# Patient Record
Sex: Female | Born: 1990 | Race: White | Hispanic: No | Marital: Married | Smoking: Never smoker
Health system: Southern US, Community
[De-identification: ages and names within clinical notes are randomized; demographics above are authoritative.]

## PROBLEM LIST (undated history)

## (undated) ENCOUNTER — Inpatient Hospital Stay (HOSPITAL_COMMUNITY): Payer: Self-pay

## (undated) DIAGNOSIS — Z789 Other specified health status: Secondary | ICD-10-CM

## (undated) DIAGNOSIS — O4400 Placenta previa specified as without hemorrhage, unspecified trimester: Secondary | ICD-10-CM

## (undated) HISTORY — PX: NO PAST SURGERIES: SHX2092

---
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
## 2010-12-28 ENCOUNTER — Other Ambulatory Visit (HOSPITAL_COMMUNITY)
Admission: RE | Admit: 2010-12-28 | Discharge: 2010-12-28 | Disposition: A | Discharge status: Home / Self Care | Payer: BC Managed Care – PPO | Source: Ambulatory Visit | Attending: Family Medicine | Admitting: Family Medicine

## 2010-12-28 DIAGNOSIS — Z124 Encounter for screening for malignant neoplasm of cervix: Secondary | ICD-10-CM | POA: Insufficient documentation

## 2012-02-01 ENCOUNTER — Other Ambulatory Visit (HOSPITAL_COMMUNITY)
Admission: RE | Admit: 2012-02-01 | Discharge: 2012-02-01 | Disposition: A | Discharge status: Home / Self Care | Payer: BC Managed Care – PPO | Source: Ambulatory Visit | Attending: Family Medicine | Admitting: Family Medicine

## 2012-02-01 DIAGNOSIS — Z124 Encounter for screening for malignant neoplasm of cervix: Secondary | ICD-10-CM | POA: Insufficient documentation

## 2013-02-10 ENCOUNTER — Other Ambulatory Visit (HOSPITAL_COMMUNITY)
Admission: RE | Admit: 2013-02-10 | Discharge: 2013-02-10 | Disposition: A | Discharge status: Home / Self Care | Payer: BC Managed Care – PPO | Source: Ambulatory Visit | Attending: Family Medicine | Admitting: Family Medicine

## 2013-02-10 DIAGNOSIS — Z124 Encounter for screening for malignant neoplasm of cervix: Secondary | ICD-10-CM | POA: Insufficient documentation

## 2016-05-29 ENCOUNTER — Other Ambulatory Visit (HOSPITAL_COMMUNITY): Payer: Self-pay | Admitting: Obstetrics and Gynecology

## 2016-05-29 DIAGNOSIS — O209 Hemorrhage in early pregnancy, unspecified: Secondary | ICD-10-CM

## 2016-05-29 LAB — OB RESULTS CONSOLE HEPATITIS B SURFACE ANTIGEN: Hepatitis B Surface Ag: NEGATIVE

## 2016-05-29 LAB — OB RESULTS CONSOLE ANTIBODY SCREEN: Antibody Screen: NEGATIVE

## 2016-05-29 LAB — OB RESULTS CONSOLE ABO/RH: RH TYPE: POSITIVE

## 2016-05-29 LAB — OB RESULTS CONSOLE RPR: RPR: NONREACTIVE

## 2016-05-29 LAB — OB RESULTS CONSOLE GC/CHLAMYDIA
CHLAMYDIA, DNA PROBE: NEGATIVE
Gonorrhea: NEGATIVE

## 2016-05-29 LAB — OB RESULTS CONSOLE GBS: STREP GROUP B AG: POSITIVE

## 2016-05-29 LAB — OB RESULTS CONSOLE RUBELLA ANTIBODY, IGM: Rubella: IMMUNE

## 2016-05-29 LAB — OB RESULTS CONSOLE HIV ANTIBODY (ROUTINE TESTING): HIV: NONREACTIVE

## 2016-06-02 ENCOUNTER — Ambulatory Visit (HOSPITAL_COMMUNITY)
Admission: RE | Admit: 2016-06-02 | Discharge: 2016-06-02 | Disposition: A | Discharge status: Home / Self Care | Payer: Self-pay | Source: Ambulatory Visit | Attending: Obstetrics and Gynecology | Admitting: Obstetrics and Gynecology

## 2016-06-02 DIAGNOSIS — Z3A01 Less than 8 weeks gestation of pregnancy: Secondary | ICD-10-CM | POA: Insufficient documentation

## 2016-06-02 DIAGNOSIS — O209 Hemorrhage in early pregnancy, unspecified: Secondary | ICD-10-CM

## 2016-06-22 ENCOUNTER — Other Ambulatory Visit: Payer: Self-pay | Admitting: Obstetrics and Gynecology

## 2016-06-22 ENCOUNTER — Other Ambulatory Visit (HOSPITAL_COMMUNITY)
Admission: RE | Admit: 2016-06-22 | Discharge: 2016-06-22 | Disposition: A | Discharge status: Home / Self Care | Payer: Self-pay | Source: Ambulatory Visit | Attending: Obstetrics and Gynecology | Admitting: Obstetrics and Gynecology

## 2016-06-22 DIAGNOSIS — Z113 Encounter for screening for infections with a predominantly sexual mode of transmission: Secondary | ICD-10-CM | POA: Insufficient documentation

## 2016-06-22 DIAGNOSIS — Z01419 Encounter for gynecological examination (general) (routine) without abnormal findings: Secondary | ICD-10-CM | POA: Insufficient documentation

## 2016-06-26 LAB — CYTOLOGY - PAP

## 2016-10-02 NOTE — L&D Delivery Note (Signed)
Delivery Note At 8:13 AM a viable female was delivered via Vaginal, Spontaneous Delivery (Presentation:Occiput right anterior).  APGAR: 9, 9; weight 8 lb 8.7 oz (3875 g).   Placenta status: retained. Anesthesia was called to administer nitroglycerin after the placenta was in utero for over 30 minutes.  Manual removal of the placenta was performed the placenta was removed intact with a 3 vessel  Cord:   Uterine atony was noted after removal of the placent. 1000 mcg of cytotec given Per rectum. ... 0.2 mg of methergine given IM.  With resolution of atony   Cord pH: NA  Anesthesia:  Epidural  Episiotomy: None Lacerations: Periurethral Suture Repair: 3.0 vicryl Est. Blood Loss (mL): 500  Mom to postpartum.  Baby to Couplet care / Skin to Skin.  Vanessa Enrico J. 01/23/2017, 10:32 AM

## 2017-01-20 ENCOUNTER — Inpatient Hospital Stay (HOSPITAL_COMMUNITY)
Admission: AD | Admit: 2017-01-20 | Discharge: 2017-01-21 | Disposition: A | Discharge status: Home / Self Care | Payer: Self-pay | Source: Ambulatory Visit | Attending: Obstetrics and Gynecology | Admitting: Obstetrics and Gynecology

## 2017-01-20 ENCOUNTER — Encounter (HOSPITAL_COMMUNITY): Payer: Self-pay | Admitting: *Deleted

## 2017-01-20 DIAGNOSIS — Z3490 Encounter for supervision of normal pregnancy, unspecified, unspecified trimester: Secondary | ICD-10-CM | POA: Insufficient documentation

## 2017-01-20 NOTE — MAU Note (Signed)
Pt reports UC's q 3-4 minutes 

## 2017-01-20 NOTE — Progress Notes (Addendum)
G1@40 +[redacted] wksga. Here for ctx. Denies lof or bleeding. +fm. EFM applied by another nurse. VSS. See flow sheet for details.   GBS+ /No allergies  2230: SVE 3.5/40/BB  2250: Provider Clyde Lundborg notified. Report status of pt given. Orders received to have pt walk for an hr and recheck cervix.   2255: pt up walking. Instructed pt to return to room at 1145.   2315: relinquished care over to RN Suzette Battiest

## 2017-01-21 MED ORDER — ZOLPIDEM TARTRATE 5 MG PO TABS
5.0000 mg | ORAL_TABLET | Freq: Once | ORAL | Status: AC
  Administered 2017-01-21: 5 mg via ORAL
  Filled 2017-01-21: qty 1

## 2017-01-21 MED ORDER — ZOLPIDEM TARTRATE 5 MG PO TABS
5.0000 mg | ORAL_TABLET | Freq: Once | ORAL | Status: DC
  Filled 2017-01-21: qty 1

## 2017-01-21 MED ORDER — ONDANSETRON HCL 4 MG/2ML IJ SOLN
4.0000 mg | Freq: Once | INTRAMUSCULAR | Status: DC
  Filled 2017-01-21: qty 2

## 2017-01-22 ENCOUNTER — Inpatient Hospital Stay (HOSPITAL_COMMUNITY)
Admission: AD | Admit: 2017-01-22 | Discharge: 2017-01-25 | DRG: 767 | Disposition: A | Discharge status: Home / Self Care | Payer: Self-pay | Source: Ambulatory Visit | Attending: Obstetrics and Gynecology | Admitting: Obstetrics and Gynecology

## 2017-01-22 ENCOUNTER — Encounter (HOSPITAL_COMMUNITY): Payer: Self-pay

## 2017-01-22 ENCOUNTER — Other Ambulatory Visit: Payer: Self-pay | Admitting: Obstetrics and Gynecology

## 2017-01-22 ENCOUNTER — Inpatient Hospital Stay (HOSPITAL_COMMUNITY): Payer: Self-pay | Admitting: Anesthesiology

## 2017-01-22 ENCOUNTER — Telehealth (HOSPITAL_COMMUNITY): Payer: Self-pay | Admitting: *Deleted

## 2017-01-22 DIAGNOSIS — O480 Post-term pregnancy: Principal | ICD-10-CM | POA: Diagnosis present

## 2017-01-22 DIAGNOSIS — Z3A41 41 weeks gestation of pregnancy: Secondary | ICD-10-CM

## 2017-01-22 DIAGNOSIS — O99824 Streptococcus B carrier state complicating childbirth: Secondary | ICD-10-CM | POA: Diagnosis present

## 2017-01-22 DIAGNOSIS — O411230 Chorioamnionitis, third trimester, not applicable or unspecified: Secondary | ICD-10-CM | POA: Diagnosis present

## 2017-01-22 HISTORY — DX: Other specified health status: Z78.9

## 2017-01-22 LAB — CBC
HCT: 35.2 % — ABNORMAL LOW (ref 36.0–46.0)
HEMOGLOBIN: 12.3 g/dL (ref 12.0–15.0)
MCH: 32.6 pg (ref 26.0–34.0)
MCHC: 34.9 g/dL (ref 30.0–36.0)
MCV: 93.4 fL (ref 78.0–100.0)
PLATELETS: 256 10*3/uL (ref 150–400)
RBC: 3.77 MIL/uL — ABNORMAL LOW (ref 3.87–5.11)
RDW: 13.3 % (ref 11.5–15.5)
WBC: 10.3 10*3/uL (ref 4.0–10.5)

## 2017-01-22 LAB — TYPE AND SCREEN
ABO/RH(D): A POS
ANTIBODY SCREEN: NEGATIVE

## 2017-01-22 LAB — ABO/RH: ABO/RH(D): A POS

## 2017-01-22 MED ORDER — LIDOCAINE HCL (PF) 1 % IJ SOLN
INTRAMUSCULAR | Status: DC | PRN
  Administered 2017-01-22: 7 mL via EPIDURAL
  Administered 2017-01-22: 6 mL via EPIDURAL

## 2017-01-22 MED ORDER — LIDOCAINE HCL (PF) 1 % IJ SOLN
30.0000 mL | INTRAMUSCULAR | Status: DC | PRN
  Filled 2017-01-22: qty 30

## 2017-01-22 MED ORDER — SOD CITRATE-CITRIC ACID 500-334 MG/5ML PO SOLN: 30.0000 mL | ORAL | Status: DC | PRN

## 2017-01-22 MED ORDER — ONDANSETRON HCL 4 MG/2ML IJ SOLN: 4.0000 mg | Freq: Four times a day (QID) | INTRAMUSCULAR | Status: DC | PRN

## 2017-01-22 MED ORDER — LACTATED RINGERS IV SOLN
500.0000 mL | Freq: Once | INTRAVENOUS | Status: AC
  Administered 2017-01-22: 500 mL via INTRAVENOUS

## 2017-01-22 MED ORDER — LACTATED RINGERS IV SOLN
INTRAVENOUS | Status: DC
  Administered 2017-01-22 – 2017-01-23 (×3): via INTRAVENOUS

## 2017-01-22 MED ORDER — OXYTOCIN 40 UNITS IN LACTATED RINGERS INFUSION - SIMPLE MED
2.5000 [IU]/h | INTRAVENOUS | Status: DC
  Administered 2017-01-23: 2.5 [IU]/h via INTRAVENOUS
  Filled 2017-01-22: qty 1000

## 2017-01-22 MED ORDER — OXYCODONE-ACETAMINOPHEN 5-325 MG PO TABS: 2.0000 | ORAL_TABLET | ORAL | Status: DC | PRN

## 2017-01-22 MED ORDER — EPHEDRINE 5 MG/ML INJ: 10.0000 mg | INTRAVENOUS | Status: DC | PRN

## 2017-01-22 MED ORDER — PENICILLIN G POT IN DEXTROSE 60000 UNIT/ML IV SOLN
3.0000 10*6.[IU] | INTRAVENOUS | Status: DC
  Administered 2017-01-22 – 2017-01-23 (×3): 3 10*6.[IU] via INTRAVENOUS
  Filled 2017-01-22 (×6): qty 50

## 2017-01-22 MED ORDER — FENTANYL 2.5 MCG/ML BUPIVACAINE 1/10 % EPIDURAL INFUSION (WH - ANES)
14.0000 mL/h | INTRAMUSCULAR | Status: DC | PRN
  Administered 2017-01-22 – 2017-01-23 (×2): 14 mL/h via EPIDURAL
  Filled 2017-01-22 (×2): qty 100

## 2017-01-22 MED ORDER — PENICILLIN G POTASSIUM 5000000 UNITS IJ SOLR
5.0000 10*6.[IU] | Freq: Once | INTRAVENOUS | Status: AC
  Administered 2017-01-22: 5 10*6.[IU] via INTRAVENOUS
  Filled 2017-01-22: qty 5

## 2017-01-22 MED ORDER — DIPHENHYDRAMINE HCL 50 MG/ML IJ SOLN: 12.5000 mg | INTRAMUSCULAR | Status: DC | PRN

## 2017-01-22 MED ORDER — LACTATED RINGERS IV SOLN
500.0000 mL | INTRAVENOUS | Status: DC | PRN
  Administered 2017-01-23: 300 mL via INTRAVENOUS

## 2017-01-22 MED ORDER — PHENYLEPHRINE 40 MCG/ML (10ML) SYRINGE FOR IV PUSH (FOR BLOOD PRESSURE SUPPORT)
80.0000 ug | PREFILLED_SYRINGE | INTRAVENOUS | Status: DC | PRN
  Filled 2017-01-22: qty 10

## 2017-01-22 MED ORDER — PHENYLEPHRINE 40 MCG/ML (10ML) SYRINGE FOR IV PUSH (FOR BLOOD PRESSURE SUPPORT): 80.0000 ug | PREFILLED_SYRINGE | INTRAVENOUS | Status: DC | PRN

## 2017-01-22 MED ORDER — ACETAMINOPHEN 325 MG PO TABS: 650.0000 mg | ORAL_TABLET | ORAL | Status: DC | PRN

## 2017-01-22 MED ORDER — OXYCODONE-ACETAMINOPHEN 5-325 MG PO TABS: 1.0000 | ORAL_TABLET | ORAL | Status: DC | PRN

## 2017-01-22 MED ORDER — OXYTOCIN BOLUS FROM INFUSION
500.0000 mL | Freq: Once | INTRAVENOUS | Status: AC
  Administered 2017-01-23: 500 mL via INTRAVENOUS

## 2017-01-22 NOTE — Telephone Encounter (Signed)
Preadmission screen  

## 2017-01-22 NOTE — H&P (Signed)
Vanessa Allison is a 26 y.o. female G1P0 at 77 wks and 0 days based on LMP 04/10/2016 confirmed by u/s with EDD 01/15/2017. Prenatal care provided by Dr. Gerald Leitz with [facility]. Pregnancy complicated by GBS + and post dates. Pt presented to the office today for BPP which was 4 out of 8 with 2 off for no tone and breathing. Pt reports + FM. She has a contraction every 8 minutes. On cervical exam in the office 7 cm with bulging bag.    OB History    Gravida Para Term Preterm AB Living   1             SAB TAB Ectopic Multiple Live Births                 No past medical history on file. No past surgical history on file. Family History: family history includes Cancer in her paternal grandmother; Heart disease in her maternal grandfather and paternal grandfather. Social History:  has no tobacco, alcohol, and drug history on file. PMH None PSurgical Hx. None  Meds: PNV Allergies NKDA    Maternal Diabetes: No Genetic Screening: Declined Maternal Ultrasounds/Referrals: Normal Fetal Ultrasounds or other Referrals:  None Maternal Substance Abuse:  No Significant Maternal Medications: None Significant Maternal Lab Results:  Lab values include: Group B Strep positive Other Comments:  None  Review of Systems  Constitutional: Negative.   HENT: Negative.   Eyes: Negative.   Respiratory: Negative.   Cardiovascular: Negative.   Gastrointestinal: Negative.   Genitourinary: Negative.   Musculoskeletal: Negative.   Skin: Negative.   Neurological: Negative.   Endo/Heme/Allergies: Negative.   Psychiatric/Behavioral: Negative.    History   Last menstrual period 04/10/2016. Maternal Exam:  Uterine Assessment: Contraction strength is moderate.  Contraction duration is 1 minute. Contraction frequency is irregular.   Abdomen: Patient reports no abdominal tenderness. Estimated fetal weight is 8 lbs 1 oz on ultrasound today ( 74%ile) .   Fetal presentation: vertex  Introitus: Normal vulva.  Normal vagina.  Pelvis: adequate for delivery.      Physical Exam  Constitutional: She is oriented to person, place, and time. She appears well-developed and well-nourished.  HENT:  Head: Normocephalic and atraumatic.  Eyes: Conjunctivae are normal. Pupils are equal, round, and reactive to light.  Neck: Normal range of motion. Neck supple.  Cardiovascular: Normal rate and regular rhythm.   Respiratory: Effort normal and breath sounds normal.  GI: There is no tenderness.  Genitourinary: Vagina normal.  Musculoskeletal: Normal range of motion. She exhibits edema.  Neurological: She is alert and oriented to person, place, and time.  Skin: Skin is warm and dry.  Psychiatric: She has a normal mood and affect.    Prenatal labs: ABO, Rh: A/Positive/-- (08/28 0000) Antibody: Negative (08/28 0000) Rubella: Immune (08/28 0000) RPR: Nonreactive (08/28 0000)  HBsAg: Negative (08/28 0000)  HIV: Non-reactive (08/28 0000)  GBS: Positive (08/28 0000)   Assessment/Plan: 41 wks and 0 days bpp 4 out of 8 in office today  Active labor admitto Labor and delivery  Continuous EFM GBS positive- Penicillin Plan AROM once penicillin is in for 4 hours  Anticipate SVD   Vanessa Allison J. 01/22/2017, 12:54 PM

## 2017-01-22 NOTE — H&P (Deleted)
  The note originally documented on this encounter has been moved the the encounter in which it belongs.  

## 2017-01-22 NOTE — Anesthesia Preprocedure Evaluation (Signed)
Anesthesia Evaluation  Patient identified by MRN, date of birth, ID band Patient awake    Reviewed: Allergy & Precautions, H&P , NPO status , Patient's Chart, lab work & pertinent test results  Airway Mallampati: II  TM Distance: >3 FB Neck ROM: full    Dental no notable dental hx.    Pulmonary neg pulmonary ROS,    Pulmonary exam normal        Cardiovascular negative cardio ROS Normal cardiovascular exam     Neuro/Psych negative neurological ROS  negative psych ROS   GI/Hepatic negative GI ROS, Neg liver ROS,   Endo/Other  negative endocrine ROS  Renal/GU negative Renal ROS  negative genitourinary   Musculoskeletal negative musculoskeletal ROS (+)   Abdominal (+) + obese,   Peds  Hematology negative hematology ROS (+)   Anesthesia Other Findings   Reproductive/Obstetrics (+) Pregnancy                             Anesthesia Physical Anesthesia Plan  ASA: II  Anesthesia Plan: Epidural   Post-op Pain Management:    Induction:   Airway Management Planned:   Additional Equipment:   Intra-op Plan:   Post-operative Plan:   Informed Consent: I have reviewed the patients History and Physical, chart, labs and discussed the procedure including the risks, benefits and alternatives for the proposed anesthesia with the patient or authorized representative who has indicated his/her understanding and acceptance.     Plan Discussed with:   Anesthesia Plan Comments:         Anesthesia Quick Evaluation  

## 2017-01-22 NOTE — Progress Notes (Signed)
Vanessa Allison is a 26 y.o. G1P0 at [redacted]w[redacted]d admitted for active labor and bpp in office 4 out of 8.  Subjective: Pt uncomfortable with contractions. More frequent. No LOF no vaginal bleeding   Objective: BP (!) 142/80   Pulse 78   Temp 99.2 F (37.3 C) (Oral)   Resp 18   Ht  (1.676 m)   Wt 95.3 kg (210 lb)   LMP 04/10/2016   BMI 33.89 kg/m  No intake/output data recorded. No intake/output data recorded.  FHT:  FHR: 140 bpm, variability: moderate,  accelerations:  Present,  decelerations:  Absent and   UC:   regular, every 2-5 minutes SVE:   Dilation: 7.5 Effacement (%): 80 Station: -1 Exam by:: Dr. Richardson Dopp  Labs: Lab Results  Component Value Date   WBC 10.3 01/22/2017   HGB 12.3 01/22/2017   HCT 35.2 (L) 01/22/2017   MCV 93.4 01/22/2017   PLT 256 01/22/2017    Assessment / Plan: Spontaneous labor, progressing normally  Labor: ARom performed. reevaluate cervix in  2 hours or with pt report of pressure  Preeclampsia:  NA Fetal Wellbeing:  Category I Pain Control:  Labor support without medications I/D:  Pencillin Anticipated MOD:  NSVD  Hargun Spurling J. 01/22/2017, 6:38 PM

## 2017-01-22 NOTE — Anesthesia Procedure Notes (Signed)
Epidural Patient location during procedure: OB Start time: 01/22/2017 11:09 PM End time: 01/22/2017 11:13 PM  Staffing Anesthesiologist: Leilani Able  Preanesthetic Checklist Completed: patient identified, surgical consent, pre-op evaluation, timeout performed, IV checked, risks and benefits discussed and monitors and equipment checked  Epidural Patient position: sitting Prep: site prepped and draped and DuraPrep Patient monitoring: continuous pulse ox and blood pressure Approach: midline Location: L3-L4 Injection technique: LOR air  Needle:  Needle type: Tuohy  Needle gauge: 17 G Needle length: 9 cm and 9 Needle insertion depth: 5 cm cm Catheter type: closed end flexible Catheter size: 19 Gauge Catheter at skin depth: 10 cm Test dose: negative and Other  Assessment Sensory level: T9 Events: blood not aspirated, injection not painful, no injection resistance, negative IV test and no paresthesia  Additional Notes Reason for block:procedure for pain

## 2017-01-23 ENCOUNTER — Encounter (HOSPITAL_COMMUNITY): Payer: Self-pay

## 2017-01-23 LAB — RPR: RPR: NONREACTIVE

## 2017-01-23 MED ORDER — BENZOCAINE-MENTHOL 20-0.5 % EX AERO
1.0000 "application " | INHALATION_SPRAY | CUTANEOUS | Status: DC | PRN
  Administered 2017-01-23: 1 via TOPICAL
  Filled 2017-01-23: qty 56

## 2017-01-23 MED ORDER — ACETAMINOPHEN 325 MG PO TABS
650.0000 mg | ORAL_TABLET | ORAL | Status: DC | PRN
  Administered 2017-01-23: 650 mg via ORAL
  Filled 2017-01-23: qty 2

## 2017-01-23 MED ORDER — SIMETHICONE 80 MG PO CHEW: 80.0000 mg | CHEWABLE_TABLET | ORAL | Status: DC | PRN

## 2017-01-23 MED ORDER — ZOLPIDEM TARTRATE 5 MG PO TABS: 5.0000 mg | ORAL_TABLET | Freq: Every evening | ORAL | Status: DC | PRN

## 2017-01-23 MED ORDER — SENNOSIDES-DOCUSATE SODIUM 8.6-50 MG PO TABS
2.0000 | ORAL_TABLET | ORAL | Status: DC
  Administered 2017-01-23 – 2017-01-25 (×2): 2 via ORAL
  Filled 2017-01-23 (×2): qty 2

## 2017-01-23 MED ORDER — ACETAMINOPHEN 500 MG PO TABS
1000.0000 mg | ORAL_TABLET | Freq: Once | ORAL | Status: AC
  Administered 2017-01-23: 1000 mg via ORAL
  Filled 2017-01-23: qty 2

## 2017-01-23 MED ORDER — MISOPROSTOL 200 MCG PO TABS
1000.0000 ug | ORAL_TABLET | Freq: Once | ORAL | Status: AC
  Administered 2017-01-23: 1000 ug via RECTAL

## 2017-01-23 MED ORDER — METHYLERGONOVINE MALEATE 0.2 MG/ML IJ SOLN: 0.2000 mg | INTRAMUSCULAR | Status: AC

## 2017-01-23 MED ORDER — PRENATAL MULTIVITAMIN CH
1.0000 | ORAL_TABLET | Freq: Every day | ORAL | Status: DC
Start: 2017-01-23 — End: 2017-01-25
  Administered 2017-01-23 – 2017-01-24 (×2): 1 via ORAL
  Filled 2017-01-23 (×2): qty 1

## 2017-01-23 MED ORDER — SODIUM CHLORIDE 0.9 % IV SOLN
3.0000 g | Freq: Four times a day (QID) | INTRAVENOUS | Status: DC
  Administered 2017-01-23 – 2017-01-24 (×5): 3 g via INTRAVENOUS
  Filled 2017-01-23 (×5): qty 3

## 2017-01-23 MED ORDER — OXYTOCIN 40 UNITS IN LACTATED RINGERS INFUSION - SIMPLE MED
1.0000 m[IU]/min | INTRAVENOUS | Status: DC
  Administered 2017-01-23: 1 m[IU]/min via INTRAVENOUS

## 2017-01-23 MED ORDER — IBUPROFEN 600 MG PO TABS
600.0000 mg | ORAL_TABLET | Freq: Four times a day (QID) | ORAL | Status: DC
  Administered 2017-01-23 – 2017-01-25 (×8): 600 mg via ORAL
  Filled 2017-01-23 (×8): qty 1

## 2017-01-23 MED ORDER — METHYLERGONOVINE MALEATE 0.2 MG PO TABS
0.2000 mg | ORAL_TABLET | ORAL | Status: AC
  Administered 2017-01-23 – 2017-01-24 (×6): 0.2 mg via ORAL
  Filled 2017-01-23 (×6): qty 1

## 2017-01-23 MED ORDER — WITCH HAZEL-GLYCERIN EX PADS: 1.0000 "application " | MEDICATED_PAD | CUTANEOUS | Status: DC | PRN

## 2017-01-23 MED ORDER — ONDANSETRON HCL 4 MG PO TABS: 4.0000 mg | ORAL_TABLET | ORAL | Status: DC | PRN

## 2017-01-23 MED ORDER — CALCIUM CARBONATE ANTACID 500 MG PO CHEW
1.0000 | CHEWABLE_TABLET | Freq: Every day | ORAL | Status: DC
  Administered 2017-01-24: 200 mg via ORAL
  Filled 2017-01-23 (×2): qty 1

## 2017-01-23 MED ORDER — COCONUT OIL OIL
1.0000 "application " | TOPICAL_OIL | Status: DC | PRN
  Administered 2017-01-25: 1 via TOPICAL
  Filled 2017-01-23: qty 120

## 2017-01-23 MED ORDER — METHYLERGONOVINE MALEATE 0.2 MG/ML IJ SOLN
0.2000 mg | Freq: Once | INTRAMUSCULAR | Status: AC
  Administered 2017-01-23: 0.2 mg via INTRAMUSCULAR

## 2017-01-23 MED ORDER — MISOPROSTOL 200 MCG PO TABS
ORAL_TABLET | ORAL | Status: AC
  Filled 2017-01-23: qty 5

## 2017-01-23 MED ORDER — OXYCODONE HCL 5 MG PO TABS: 10.0000 mg | ORAL_TABLET | ORAL | Status: DC | PRN

## 2017-01-23 MED ORDER — DIBUCAINE 1 % RE OINT: 1.0000 "application " | TOPICAL_OINTMENT | RECTAL | Status: DC | PRN

## 2017-01-23 MED ORDER — FERROUS SULFATE 325 (65 FE) MG PO TABS
325.0000 mg | ORAL_TABLET | Freq: Two times a day (BID) | ORAL | Status: DC
  Administered 2017-01-23 – 2017-01-24 (×3): 325 mg via ORAL
  Filled 2017-01-23 (×4): qty 1

## 2017-01-23 MED ORDER — OXYCODONE HCL 5 MG PO TABS: 5.0000 mg | ORAL_TABLET | ORAL | Status: DC | PRN

## 2017-01-23 MED ORDER — METHYLERGONOVINE MALEATE 0.2 MG/ML IJ SOLN
INTRAMUSCULAR | Status: AC
  Filled 2017-01-23: qty 1

## 2017-01-23 MED ORDER — NITROGLYCERIN 0.4 MG/SPRAY TL SOLN
Status: AC
  Filled 2017-01-23: qty 4.9

## 2017-01-23 MED ORDER — ONDANSETRON HCL 4 MG/2ML IJ SOLN: 4.0000 mg | INTRAMUSCULAR | Status: DC | PRN

## 2017-01-23 MED ORDER — DIPHENHYDRAMINE HCL 25 MG PO CAPS: 25.0000 mg | ORAL_CAPSULE | Freq: Four times a day (QID) | ORAL | Status: DC | PRN

## 2017-01-23 NOTE — Progress Notes (Signed)
Subjective: Comfortable after epidural  Objective: BP (!) 113/59   Pulse 78   Temp 98.4 F (36.9 C) (Oral)   Resp 17   Ht  (1.676 m)   Wt 95.3 kg (210 lb)   LMP 04/10/2016   SpO2 97%   BMI 33.89 kg/m  No intake/output data recorded. No intake/output data recorded.  FHT: Category 1 UC:   regular, every 3 minutes SVE:   Dilation: Lip/rim Effacement (%): 100 Station: +1 Exam by:: Mary [place] Johnson, RN  Assessment:  G1P0 at 41 weeks IUP induction of labor Cat 1 strip  Plan: Labor down Anticipate SVD  Kenney Houseman CNM, MSN 01/23/2017, 12:09 AM

## 2017-01-23 NOTE — Lactation Note (Signed)
This note was copied from a baby's chart. Lactation Consultation Note  Patient Name: Vanessa Allison [id number] Date: 01/23/2017 Reason for consult: Initial assessment Baby at 7 hr of life. Upon entry baby was cueing. Mom was agreeable to latch help. RN reported mom had retained placenta that was manually removed and EBL of 500+. With visual assessment, baby has a nice gape, good lateralization of tongue, extends tongue over gum ridge, and lifts tongue to midline. Mom denies breast or nipple pain. Discussed baby behavior, feeding frequency, baby belly size, voids, wt loss, breast changes, and nipple care. Mom stated she has manually expressed and there is a spoon at the bedside. Given lactation handouts. Aware of OP services and support group. Mom will offer the breast on demand 8+/24hr. If it appears that her milk is delayed she understands her feeding plan will change.    Maternal Data Has patient been taught Hand Expression?: Yes Does the patient have breastfeeding experience prior to this delivery?: No  Feeding Feeding Type: Breast Fed Length of feed: 6 min  LATCH Score/Interventions Latch: Repeated attempts needed to sustain latch, nipple held in mouth throughout feeding, stimulation needed to elicit sucking reflex. Intervention(s): Adjust position;Breast compression  Audible Swallowing: A few with stimulation Intervention(s): Skin to skin Intervention(s): Alternate breast massage  Type of Nipple: Everted at rest and after stimulation  Comfort (Breast/Nipple): Soft / non-tender     Hold (Positioning): Assistance needed to correctly position infant at breast and maintain latch. Intervention(s): Position options;Support Pillows  LATCH Score: 7  Lactation Tools Discussed/Used     Consult Status Consult Status: Follow-up Date: 01/24/17 Follow-up type: In-patient    Vanessa Allison 01/23/2017, 3:54 PM

## 2017-01-23 NOTE — Anesthesia Postprocedure Evaluation (Signed)
Anesthesia Post Note  Patient: Vanessa Allison  Procedure(s) Performed: * No procedures listed *  Patient location during evaluation: Mother Baby Anesthesia Type: Epidural Level of consciousness: awake and alert and oriented Pain management: pain level controlled Vital Signs Assessment: post-procedure vital signs reviewed and stable Respiratory status: spontaneous breathing and nonlabored ventilation Cardiovascular status: stable Postop Assessment: no headache, patient able to bend at knees, no signs of nausea or vomiting, no backache, epidural receding and adequate PO intake Anesthetic complications: no        Last Vitals:  Vitals:   01/23/17 1145 01/23/17 1244  BP: 134/72 132/71  Pulse: 70 62  Resp: 16 16  Temp: 37.2 C 37.1 C    Last Pain:  Vitals:   01/23/17 1244  TempSrc: Oral  PainSc:    Pain Goal: Patients Stated Pain Goal: 3 (01/23/17 1203)               Nichoals Heyde Hristova

## 2017-01-23 NOTE — Progress Notes (Signed)
Subjective: Pt comfortable with epidural.  Feeling some rectal pressure  Objective: BP 114/62   Pulse 86   Temp 98.6 F (37 C) (Oral)   Resp 16   Ht  (1.676 m)   Wt 95.3 kg (210 lb)   LMP 04/10/2016   SpO2 97%   BMI 33.89 kg/m  No intake/output data recorded. No intake/output data recorded.  FHT: Category 1 UC:   regular, every 3-4 minutes SVE:   Dilation: 9 Effacement (%): 100 Station: 0 Exam by:: N. Prothero, CNM   Assessment:  Pt is a 26 yo G1P0 at 41 wks IUP induction for postdates Cat 1 strip  Plan: Start pitocin for adequate contractions.  Discussed plan with pt and husband.  Risk and benefits discussed.  Agreed with poc  Kenney Houseman CNM, MSN 01/23/2017, 2:20 AM

## 2017-01-24 LAB — CBC
HEMATOCRIT: 35.1 % — AB (ref 36.0–46.0)
HEMOGLOBIN: 12.1 g/dL (ref 12.0–15.0)
MCH: 32.5 pg (ref 26.0–34.0)
MCHC: 34.5 g/dL (ref 30.0–36.0)
MCV: 94.4 fL (ref 78.0–100.0)
PLATELETS: 175 10*3/uL (ref 150–400)
RBC: 3.72 MIL/uL — ABNORMAL LOW (ref 3.87–5.11)
RDW: 13.7 % (ref 11.5–15.5)
WBC: 20.8 10*3/uL — ABNORMAL HIGH (ref 4.0–10.5)

## 2017-01-24 NOTE — Progress Notes (Signed)
UR chart review completed.  

## 2017-01-24 NOTE — Progress Notes (Signed)
Post Partum Day 1 SVD manual removal of retained placenta  Subjective: no complaints, up ad lib, voiding, tolerating PO and + flatus  Objective: Blood pressure 119/76, pulse (!) 57, temperature 98 F (36.7 C), resp. rate 18, height  (1.676 m), weight 95.3 kg (210 lb), last menstrual period 04/10/2016, SpO2 98 %, unknown if currently breastfeeding.  Physical Exam:  General: alert and cooperative Lochia: appropriate Uterine Fundus: firm Incision: NA DVT Evaluation: No evidence of DVT seen on physical exam.   Recent Labs  01/22/17 1350 01/24/17 0528  HGB 12.3 12.1  HCT 35.2* 35.1*    Assessment/Plan: Plan for discharge tomorrow, Breastfeeding and Circumcision prior to discharge  Circumcision completed  Chorioamnionitis and manual removal of placenta. Pt afebrile for over 24 hours . Unasyn discontinued.  Dr. Dion Body covering 01/24/2017 PPV in 6 wks   LOS: 2 days   Rayan Dyal J. 01/24/2017, 5:30 PM

## 2017-01-24 NOTE — Discharge Instructions (Signed)

## 2017-01-25 MED ORDER — IBUPROFEN 600 MG PO TABS: 600.0000 mg | ORAL_TABLET | Freq: Four times a day (QID) | ORAL | 0 refills | Status: DC

## 2017-01-25 NOTE — Lactation Note (Signed)
This note was copied from a baby's chart. Lactation Consultation Note New mom states BF going well. Baby has cluster fed tonight. Short shaft nipples red. Coconut oil given. Shells given. Breast and nipples very compressible. Mom demonstrated hand expression.  Discussed breast filling, engorgement, supply and demand. Encouraged mom to relax and be comfortable while BF. Encouraged to cont. I&O after discharge. Reviewed LC services and community services.  Patient Name: Vanessa Allison [id number] Date: 01/25/2017 Reason for consult: Follow-up assessment   Maternal Data    Feeding    LATCH Score/Interventions       Type of Nipple: Everted at rest and after stimulation  Comfort (Breast/Nipple): Filling, red/small blisters or bruises, mild/mod discomfort  Problem noted: Mild/Moderate discomfort  Intervention(s): Breastfeeding basics reviewed;Support Pillows;Position options;Skin to skin     Lactation Tools Discussed/Used Tools: Shells;Pump Shell Type: Inverted Breast pump type: Manual Pump Review: Setup, frequency, and cleaning;Milk Storage Initiated by:: Peri Jefferson RN IBCLC Date initiated:: 01/25/17   Consult Status Consult Status: PRN Date: 01/25/17 Follow-up type: In-patient    Charyl Dancer 01/25/2017, 4:41 AM

## 2017-01-25 NOTE — Lactation Note (Signed)
This note was copied from a baby's chart. Lactation Consultation Note  Patient Name: Boy Khalis Hittle WUJWJ'X Date: 01/25/2017 Reason for consult: Follow-up assessment Baby at 49 hr of life. Dyad set for d/c today. Mom reports baby is latching well. He has been cluster feeding the last 2 nights. Reviewed spoon feeding expressed milk as needed after bf. She has DEBP at home and knows how to use it. Discussed baby behavior, feeding frequency, voids, wt loss, breast changes, and nipple care. Parents are aware of lactation services and support group. They will call as needed.    Maternal Data    Feeding Feeding Type: Breast Fed Length of feed: 20 min  LATCH Score/Interventions Latch: Grasps breast easily, tongue down, lips flanged, rhythmical sucking.  Audible Swallowing: A few with stimulation Intervention(s): Skin to skin;Hand expression  Type of Nipple: Everted at rest and after stimulation  Comfort (Breast/Nipple): Soft / non-tender     Hold (Positioning): No assistance needed to correctly position infant at breast.  LATCH Score: 9  Lactation Tools Discussed/Used     Consult Status Consult Status: Complete Follow-up type: Call as needed    MANAR SMALLING 01/25/2017, 9:53 AM

## 2017-01-25 NOTE — Discharge Summary (Signed)
OB Discharge Summary     Patient Name: Vanessa Allison DOB: [birth year] MRN: [id number]  Date of admission: 01/22/2017 Delivering MD: Gerald Leitz   Date of discharge: 01/25/2017  Admitting diagnosis: LABOR Intrauterine pregnancy: [redacted]w[redacted]d     Secondary diagnosis:  Active Problems:   Normal labor  Additional problems: None     Discharge diagnosis: Term Pregnancy Delivered                                                                                                Post partum procedures:Manual extraction of antibiotics.  Unasyn x 24 hours.  Augmentation: AROM and Pitocin  Complications: Retained placenta  Hospital course:  Induction of Labor With Vaginal Delivery   26 y.o. yo G1P1001 at [redacted]w[redacted]d was admitted to the hospital 01/22/2017 for induction of labor.  Indication for induction: Postdates and BPP 4/8.  Patient had an uncomplicated labor course as follows: Membrane Rupture Time/Date: 6:28 PM ,01/22/2017   Intrapartum Procedures: Episiotomy: None [1]                                         Lacerations:  Periurethral [8]  Patient had delivery of a Viable infant.  Information for the patient's newborn:  Asra, Gambrel [[id number]]  Delivery Method: Vag-Spont   01/23/2017  Details of delivery can be found in separate delivery note.  Patient had a routine postpartum course. Patient is discharged home 01/25/17.  Physical exam  Vitals:   01/23/17 1700 01/23/17 2358 01/24/17 0616 01/25/17 0500  BP: 123/74 127/71 119/76 122/69  Pulse: 73 66 (!) 57 62  Resp: Temp: 98.2 F (36.8 C) 98.4 F (36.9 C) 98 F (36.7 C) 98.3 F (36.8 C)  TempSrc: Oral Oral  Oral  SpO2:      Weight:      Height:       General: alert, cooperative and no distress Lochia: appropriate Uterine Fundus: firm Incision: N/A DVT Evaluation: No evidence of DVT seen on physical exam. Calf/Ankle edema is present Labs: Lab Results  Component Value Date   WBC 20.8 (H) 01/24/2017   HGB 12.1  01/24/2017   HCT 35.1 (L) 01/24/2017   MCV 94.4 01/24/2017   PLT 175 01/24/2017   No flowsheet data found.  Discharge instruction: per After Visit Summary and "Baby and Me Booklet".  After visit meds:  Allergies as of 01/25/2017   No Known Allergies     Medication List    TAKE these medications   acetaminophen 325 MG tablet Commonly known as:  TYLENOL Take 650 mg by mouth every 6 (six) hours as needed.   calcium carbonate 500 MG chewable tablet Commonly known as:  TUMS - dosed in mg elemental calcium Chew 1 tablet by mouth daily.   ibuprofen 600 MG tablet Commonly known as:  ADVIL,MOTRIN Take 1 tablet (600 mg total) by mouth every 6 (six) hours.   multivitamin-prenatal 27-0.8 MG Tabs tablet Take 1 tablet by mouth daily at 12  noon.       Diet: routine diet  Activity: Advance as tolerated. Pelvic rest for 6 weeks.   Outpatient follow up:6 weeks Follow up Appt:No future appointments. Follow up Visit:No Follow-up on file.  Postpartum contraception: Unsure  Newborn Data: Live born female  Birth Weight: 8 lb 8.7 oz (3875 g) APGAR: 9, 9  Baby Feeding: Breast Disposition:home with mother   01/25/2017 Geryl Rankins, MD

## 2017-01-27 ENCOUNTER — Inpatient Hospital Stay (HOSPITAL_COMMUNITY): Admission: RE | Admit: 2017-01-27 | Payer: Self-pay | Source: Ambulatory Visit

## 2017-10-02 NOTE — L&D Delivery Note (Signed)
Delivery Note At 2:46 PM a viable female was delivered via Vaginal, Spontaneous (Presentation: ;  ).  APGAR: , ; weight  .   Placenta status: , .  Cord:  with the following complications: .  Cord pH: na  Anesthesia:   Episiotomy: None Lacerations: None Suture Repair: na Est. Blood Loss (mL): 200  Mom to postpartum.  Baby to Couplet care / Skin to Skin.  Celene Pippins A 07/20/2018, 3:13 PM

## 2017-10-23 ENCOUNTER — Other Ambulatory Visit (HOSPITAL_COMMUNITY)
Admission: RE | Admit: 2017-10-23 | Discharge: 2017-10-23 | Disposition: A | Discharge status: Home / Self Care | Payer: Self-pay | Source: Ambulatory Visit | Attending: Obstetrics and Gynecology | Admitting: Obstetrics and Gynecology

## 2017-10-23 ENCOUNTER — Other Ambulatory Visit: Payer: Self-pay | Admitting: Obstetrics and Gynecology

## 2017-10-23 DIAGNOSIS — Z01419 Encounter for gynecological examination (general) (routine) without abnormal findings: Secondary | ICD-10-CM | POA: Insufficient documentation

## 2017-10-25 LAB — CYTOLOGY - PAP: Diagnosis: NEGATIVE

## 2017-12-12 LAB — OB RESULTS CONSOLE GC/CHLAMYDIA
Chlamydia: NEGATIVE
GC PROBE AMP, GENITAL: NEGATIVE

## 2017-12-12 LAB — OB RESULTS CONSOLE HEPATITIS B SURFACE ANTIGEN: Hepatitis B Surface Ag: NEGATIVE

## 2017-12-12 LAB — OB RESULTS CONSOLE RPR: RPR: NONREACTIVE

## 2017-12-12 LAB — OB RESULTS CONSOLE ANTIBODY SCREEN: Antibody Screen: NEGATIVE

## 2017-12-12 LAB — OB RESULTS CONSOLE RUBELLA ANTIBODY, IGM: Rubella: IMMUNE

## 2017-12-12 LAB — OB RESULTS CONSOLE ABO/RH: RH Type: POSITIVE

## 2017-12-12 LAB — OB RESULTS CONSOLE HIV ANTIBODY (ROUTINE TESTING): HIV: NONREACTIVE

## 2017-12-19 ENCOUNTER — Other Ambulatory Visit (HOSPITAL_COMMUNITY): Payer: Self-pay | Admitting: Obstetrics and Gynecology

## 2017-12-19 ENCOUNTER — Ambulatory Visit (HOSPITAL_COMMUNITY)
Admission: RE | Admit: 2017-12-19 | Discharge: 2017-12-19 | Disposition: A | Discharge status: Home / Self Care | Payer: Self-pay | Source: Ambulatory Visit | Attending: Obstetrics and Gynecology | Admitting: Obstetrics and Gynecology

## 2017-12-19 DIAGNOSIS — IMO0002 Reserved for concepts with insufficient information to code with codable children: Secondary | ICD-10-CM

## 2017-12-19 DIAGNOSIS — O368310 Maternal care for abnormalities of the fetal heart rate or rhythm, first trimester, not applicable or unspecified: Secondary | ICD-10-CM | POA: Insufficient documentation

## 2017-12-19 DIAGNOSIS — Z3A10 10 weeks gestation of pregnancy: Secondary | ICD-10-CM | POA: Insufficient documentation

## 2018-04-08 IMAGING — US US OB < 14 WEEKS - US OB TV
2 of 4 series · 13 of 28 positions shown · non-contrast
Comparison: None.

CLINICAL DATA: Fetal heart tones not heard

EXAM:
OBSTETRIC <14 WK US AND TRANSVAGINAL OB US
TECHNIQUE: Both transabdominal and transvaginal ultrasound examinations were
performed for complete evaluation of the gestation as well as the
maternal uterus, adnexal regions, and pelvic cul-de-sac.
Transvaginal technique was performed to assess early pregnancy.

[Series 1: us ob < 14 weeks - us ob tv · 0.20mm/px · 12 of 42 slices shown (1 of 2)]
[im 2/42]
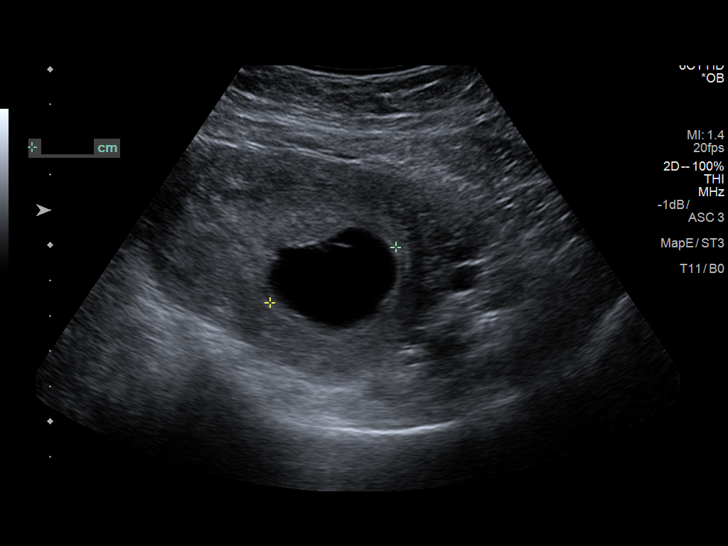
[im 6/42]
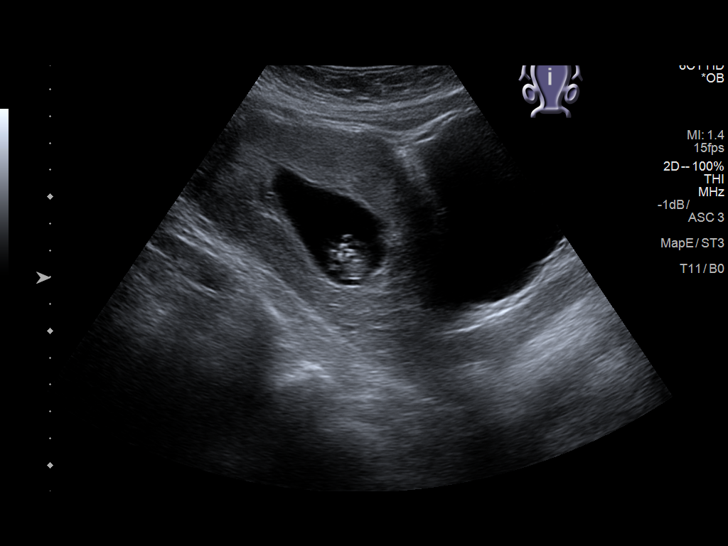
[im 9/42]
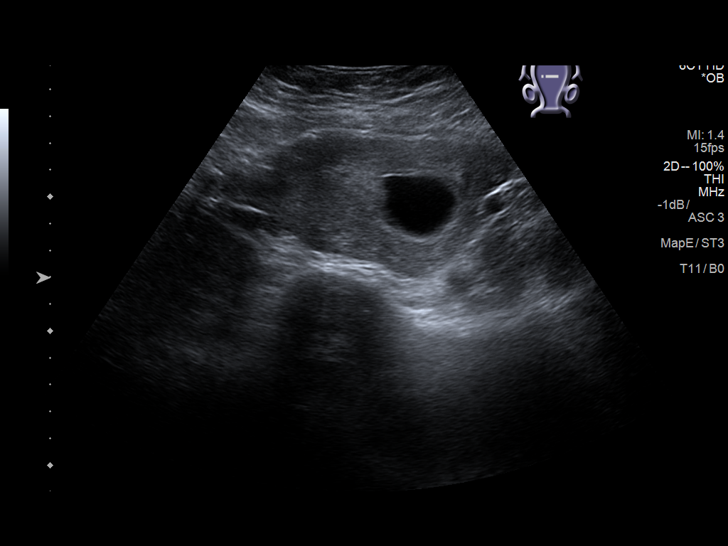
[im 12/42]
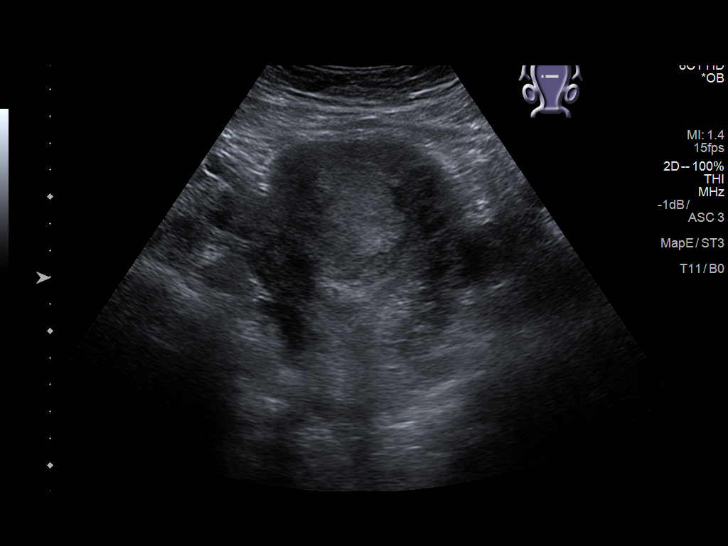
[im 16/42]
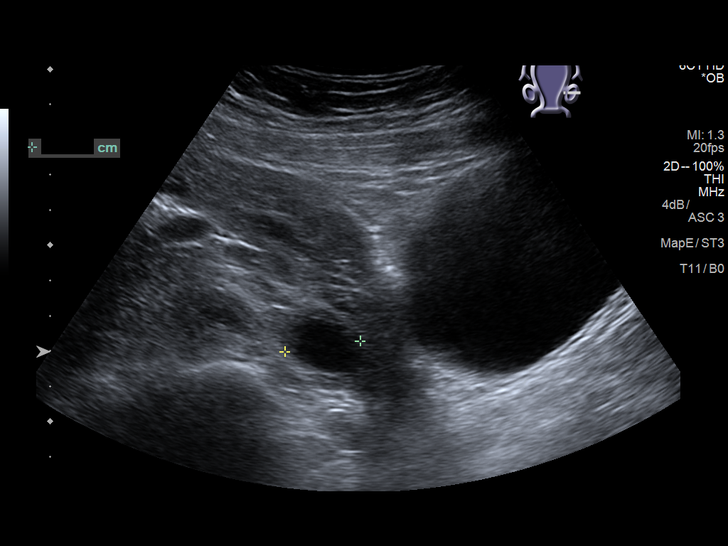
[im 19/42]
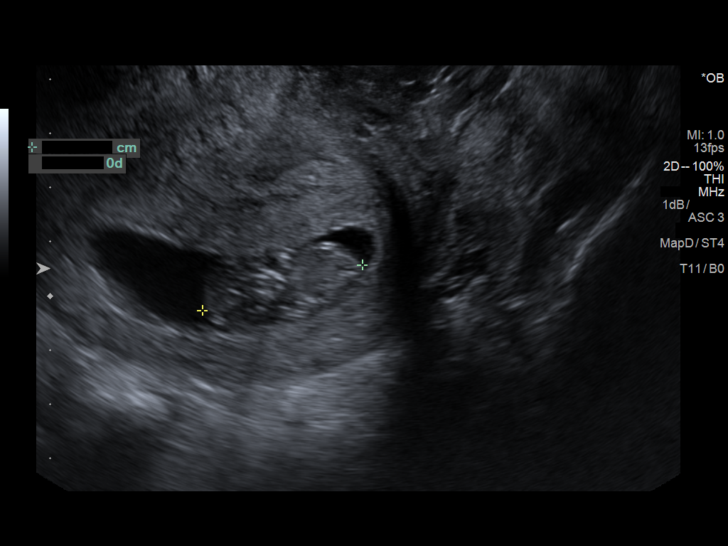
[im 24/42]
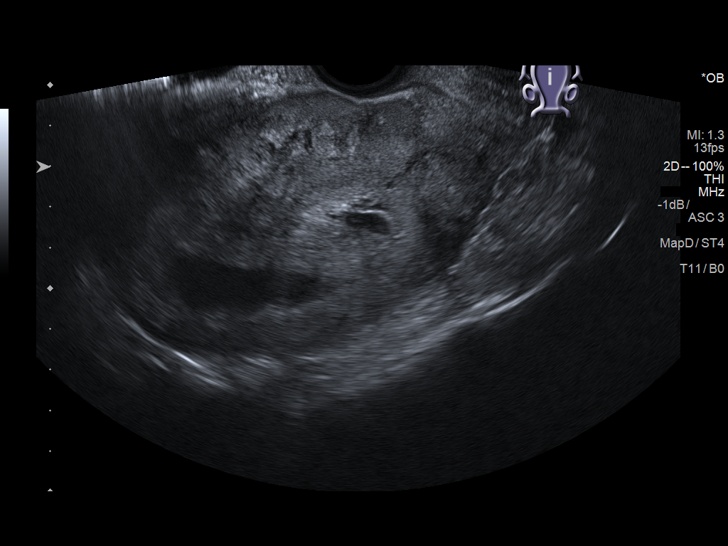
[im 28/42]
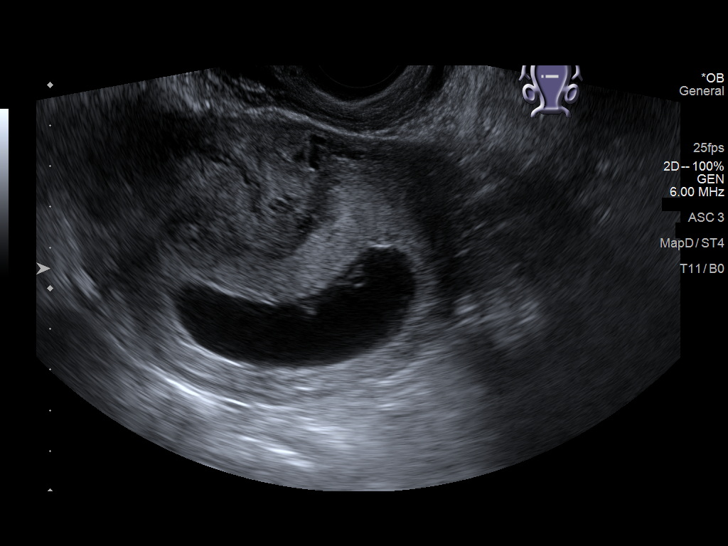
[im 31/42]
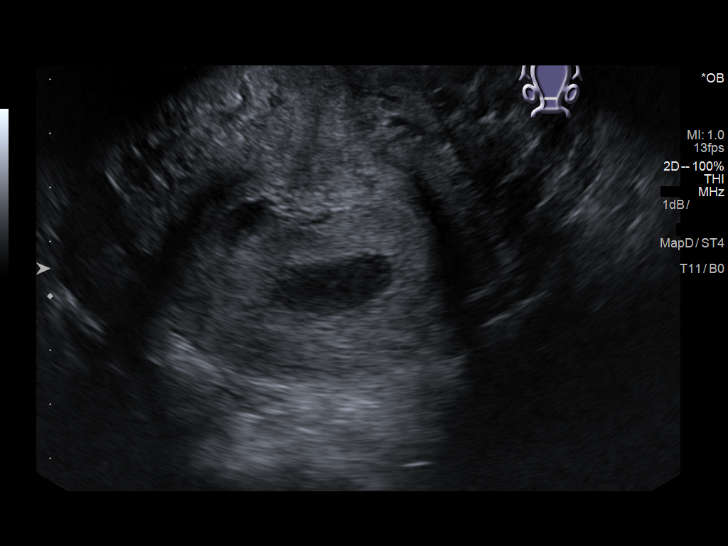
[im 35/42]
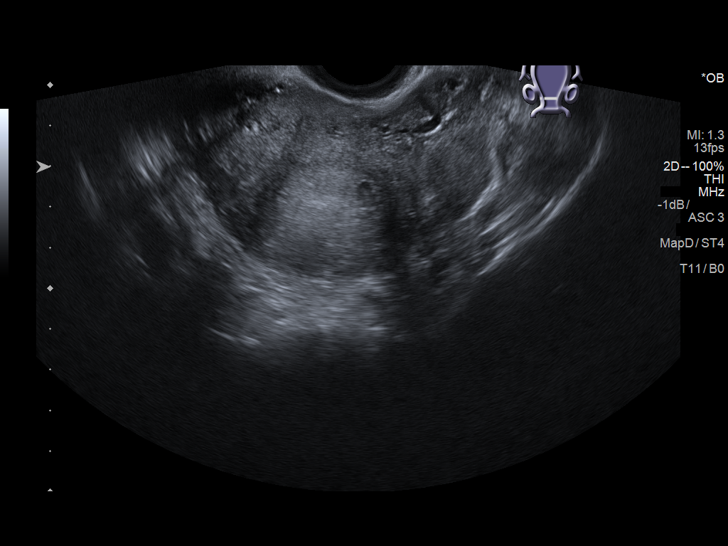
[im 38/42]
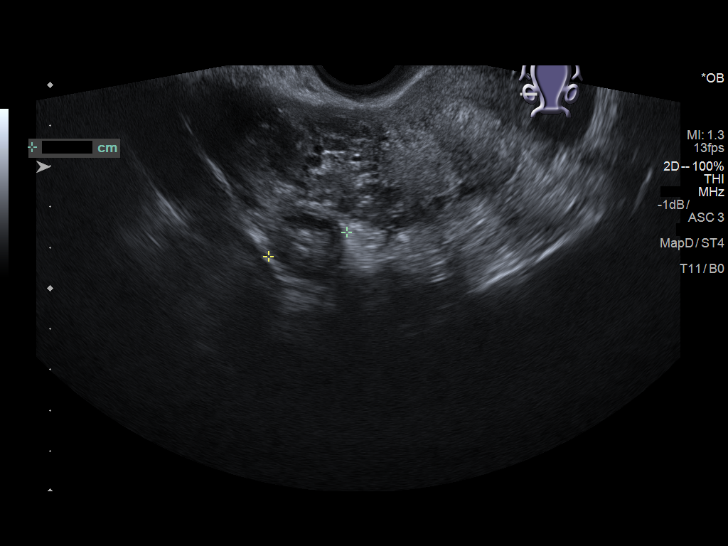
[im 42/42]
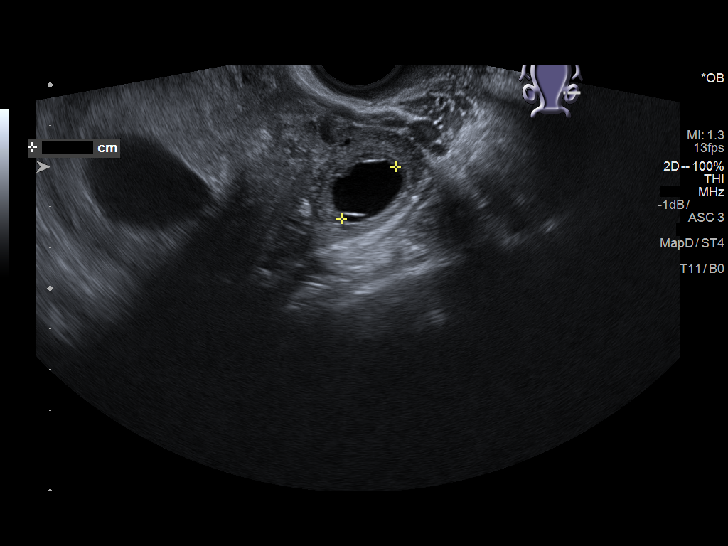

[Series 1: us ob < 14 weeks - us ob tv · 1 of 138 frames shown (2 of 2)]
[frame 70/138]
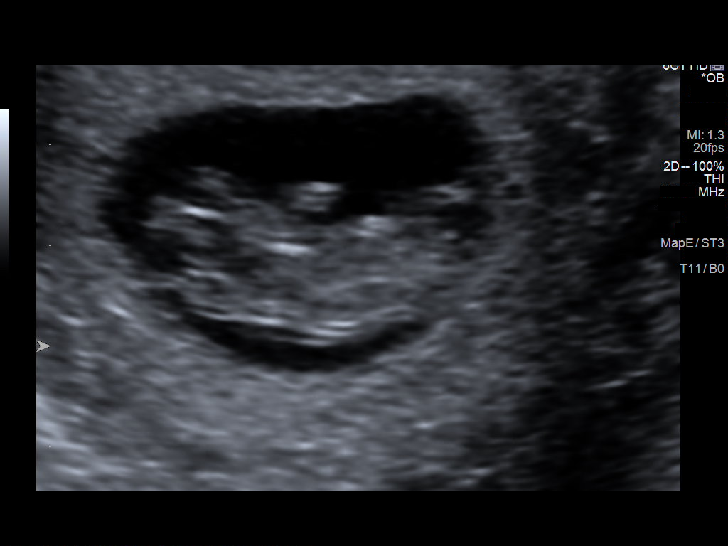

[13 of 28 positions shown; findings below may reference images not displayed]

FINDINGS: Intrauterine gestational sac: Single

Yolk sac:  Not Visualized.

Embryo:  Visualized.

Cardiac Activity: Visualized.

Heart Rate: 173 bpm

CRL: 30.7  mm   10 w   3 d                  US EDC: 07/14/2018

Subchorionic hemorrhage:  None visualized.

Maternal uterus/adnexae: No adnexal mass. 1.6 x 1.5 x 1.8 cm cystic
area in the left ovary. Ovaries otherwise normal. No pelvic free
fluid.
IMPRESSION: 1. Single live intrauterine pregnancy as described above. Normal
fetal heart rate measuring 173 beats per minute.

## 2018-06-20 LAB — OB RESULTS CONSOLE GBS: STREP GROUP B AG: POSITIVE

## 2018-07-09 ENCOUNTER — Encounter (HOSPITAL_COMMUNITY): Payer: Self-pay | Admitting: *Deleted

## 2018-07-09 ENCOUNTER — Telehealth (HOSPITAL_COMMUNITY): Payer: Self-pay | Admitting: *Deleted

## 2018-07-09 NOTE — Telephone Encounter (Signed)
Preadmission screen  

## 2018-07-17 ENCOUNTER — Inpatient Hospital Stay (HOSPITAL_COMMUNITY): Admission: RE | Admit: 2018-07-17 | Payer: Self-pay | Source: Ambulatory Visit

## 2018-07-19 ENCOUNTER — Encounter (HOSPITAL_COMMUNITY): Payer: Self-pay

## 2018-07-19 ENCOUNTER — Inpatient Hospital Stay (HOSPITAL_COMMUNITY)
Admission: AD | Admit: 2018-07-19 | Discharge: 2018-07-19 | Disposition: A | Discharge status: Home / Self Care | Payer: Self-pay | Source: Ambulatory Visit | Attending: Obstetrics & Gynecology | Admitting: Obstetrics & Gynecology

## 2018-07-19 DIAGNOSIS — O479 False labor, unspecified: Secondary | ICD-10-CM

## 2018-07-19 DIAGNOSIS — O471 False labor at or after 37 completed weeks of gestation: Secondary | ICD-10-CM | POA: Insufficient documentation

## 2018-07-19 DIAGNOSIS — Z3A40 40 weeks gestation of pregnancy: Secondary | ICD-10-CM | POA: Insufficient documentation

## 2018-07-19 NOTE — MAU Note (Signed)
Pt having cntrx since 0400. 3 mins apart since 1700. Denies LOF or bleeding.+FM

## 2018-07-19 NOTE — Discharge Instructions (Signed)
° °Braxton Hicks Contractions °Contractions of the uterus can occur throughout pregnancy, but they are not always a sign that you are in labor. You may have practice contractions called Braxton Hicks contractions. These false labor contractions are sometimes confused with true labor. °What are Braxton Hicks contractions? °Braxton Hicks contractions are tightening movements that occur in the muscles of the uterus before labor. Unlike true labor contractions, these contractions do not result in opening (dilation) and thinning of the cervix. Toward the end of pregnancy (32-34 weeks), Braxton Hicks contractions can happen more often and may become stronger. These contractions are sometimes difficult to tell apart from true labor because they can be very uncomfortable. You should not feel embarrassed if you go to the hospital with false labor. °Sometimes, the only way to tell if you are in true labor is for your health care provider to look for changes in the cervix. The health care provider will do a physical exam and may monitor your contractions. If you are not in true labor, the exam should show that your cervix is not dilating and your water has not broken. °If there are other health problems associated with your pregnancy, it is completely safe for you to be sent home with false labor. You may continue to have Braxton Hicks contractions until you go into true labor. °How to tell the difference between true labor and false labor °True labor °· Contractions last 30-70 seconds. °· Contractions become very regular. °· Discomfort is usually felt in the top of the uterus, and it spreads to the lower abdomen and low back. °· Contractions do not go away with walking. °· Contractions usually become more intense and increase in frequency. °· The cervix dilates and gets thinner. °False labor °· Contractions are usually shorter and not as strong as true labor contractions. °· Contractions are usually  irregular. °· Contractions are often felt in the front of the lower abdomen and in the groin. °· Contractions may go away when you walk around or change positions while lying down. °· Contractions get weaker and are shorter-lasting as time goes on. °· The cervix usually does not dilate or become thin. °Follow these instructions at home: °· Take over-the-counter and prescription medicines only as told by your health care provider. °· Keep up with your usual exercises and follow other instructions from your health care provider. °· Eat and drink lightly if you think you are going into labor. °· If Braxton Hicks contractions are making you uncomfortable: °? Change your position from lying down or resting to walking, or change from walking to resting. °? Sit and rest in a tub of warm water. °? Drink enough fluid to keep your urine pale yellow. Dehydration may cause these contractions. °? Do slow and deep breathing several times an hour. °· Keep all follow-up prenatal visits as told by your health care provider. This is important. °Contact a health care provider if: °· You have a fever. °· You have continuous pain in your abdomen. °Get help right away if: °· Your contractions become stronger, more regular, and closer together. °· You have fluid leaking or gushing from your vagina. °· You pass blood-tinged mucus (bloody show). °· You have bleeding from your vagina. °· You have low back pain that you never had before. °· You feel your baby’s head pushing down and causing pelvic pressure. °· Your baby is not moving inside you as much as it used to. °Summary °· Contractions that occur before labor are called   Hicks contractions, false labor, or practice contractions. °· Braxton Hicks contractions are usually shorter, weaker, farther apart, and less regular than true labor contractions. True labor contractions usually become progressively stronger and regular and they become more frequent. °· Manage discomfort from Braxton Hicks contractions by  changing position, resting in a warm bath, drinking plenty of water, or practicing deep breathing. °This information is not intended to replace advice given to you by your health care provider. Make sure you discuss any questions you have with your health care provider. °Document Released: 02/01/2017 Document Revised: 02/01/2017 Document Reviewed: 02/01/2017 °Elsevier Interactive Patient Education © 2018 [employer] °Call your OB Clinic or go to [facility] if: °· You begin to have strong, frequent contractions °· Your water breaks.  Sometimes it is a big gush of fluid, sometimes it is just a trickle that keeps getting your panties wet or running down your legs °· You have vaginal bleeding.  It is normal to have a small amount of spotting if your cervix was checked.  °· You don't feel your baby moving like normal.  If you don't, get you something to eat and drink and lay down and focus on feeling your baby move.  You should feel at least 10 movements in 2 hours.  If you don't, you should call the office or go to [facility].  ° °

## 2018-07-19 NOTE — MAU Note (Signed)
Pt is contracting q3 per husband. She is alternating between standing bent over the bed and sitting up rocking, so not tracing contractions well. Patient is breathing through contractions and rating pain an 8.  Per Dr. Earlene Plater she is okay to go home but come back if cntx continue to be like this for the next few hours. Gave her labor precautions and advised to hydrate well and try resting to see if contractions decrease.  Patient indicated understanding of these instructions.

## 2018-07-19 NOTE — MAU Note (Signed)
I have communicated with Dr L. Earlene Plater and reviewed vital signs:  Vitals:   07/19/18 1928 07/19/18 2225  BP: 130/77 135/84  Pulse: (!) 106   Resp: 16   Temp: 98.3 F (36.8 C)   SpO2: 100%     Vaginal exam:  Dilation: 3.5 Effacement (%): 60 Cervical Position: Middle Station: -2 Presentation: Vertex Exam by:: TLYTLE ,   Also reviewed contraction pattern and that non-stress test is reactive.  It has been documented that patient is contracting every 3 minutes with some cervical change over 3  hours not indicating active labor.  Patient denies any other complaints.  Based on this report provider has given order for discharge.  A discharge order and diagnosis entered by a provider.   Labor discharge instructions reviewed with patient.

## 2018-07-20 ENCOUNTER — Inpatient Hospital Stay (HOSPITAL_COMMUNITY)
Admission: AD | Admit: 2018-07-20 | Discharge: 2018-07-22 | DRG: 807 | Disposition: A | Discharge status: Home / Self Care | Payer: Self-pay | Attending: Obstetrics and Gynecology | Admitting: Obstetrics and Gynecology

## 2018-07-20 ENCOUNTER — Inpatient Hospital Stay (HOSPITAL_COMMUNITY): Payer: Self-pay | Admitting: Anesthesiology

## 2018-07-20 ENCOUNTER — Encounter (HOSPITAL_COMMUNITY): Payer: Self-pay

## 2018-07-20 ENCOUNTER — Other Ambulatory Visit: Payer: Self-pay

## 2018-07-20 DIAGNOSIS — Z23 Encounter for immunization: Secondary | ICD-10-CM

## 2018-07-20 DIAGNOSIS — Z3A40 40 weeks gestation of pregnancy: Secondary | ICD-10-CM

## 2018-07-20 LAB — CBC
HEMATOCRIT: 38.1 % (ref 36.0–46.0)
HEMOGLOBIN: 12.7 g/dL (ref 12.0–15.0)
MCH: 31.4 pg (ref 26.0–34.0)
MCHC: 33.3 g/dL (ref 30.0–36.0)
MCV: 94.3 fL (ref 80.0–100.0)
Platelets: 232 10*3/uL (ref 150–400)
RBC: 4.04 MIL/uL (ref 3.87–5.11)
RDW: 13.4 % (ref 11.5–15.5)
WBC: 12.5 10*3/uL — AB (ref 4.0–10.5)
nRBC: 0 % (ref 0.0–0.2)

## 2018-07-20 LAB — TYPE AND SCREEN
ABO/RH(D): A POS
ANTIBODY SCREEN: NEGATIVE

## 2018-07-20 MED ORDER — INFLUENZA VAC SPLIT QUAD 0.5 ML IM SUSY
0.5000 mL | PREFILLED_SYRINGE | INTRAMUSCULAR | Status: AC
  Administered 2018-07-22: 0.5 mL via INTRAMUSCULAR
  Filled 2018-07-20: qty 0.5

## 2018-07-20 MED ORDER — TETANUS-DIPHTH-ACELL PERTUSSIS 5-2.5-18.5 LF-MCG/0.5 IM SUSP: 0.5000 mL | Freq: Once | INTRAMUSCULAR | Status: DC

## 2018-07-20 MED ORDER — OXYCODONE-ACETAMINOPHEN 5-325 MG PO TABS: 1.0000 | ORAL_TABLET | ORAL | Status: DC | PRN

## 2018-07-20 MED ORDER — DIPHENHYDRAMINE HCL 25 MG PO CAPS: 25.0000 mg | ORAL_CAPSULE | Freq: Four times a day (QID) | ORAL | Status: DC | PRN

## 2018-07-20 MED ORDER — BENZOCAINE-MENTHOL 20-0.5 % EX AERO
1.0000 "application " | INHALATION_SPRAY | CUTANEOUS | Status: DC | PRN
  Filled 2018-07-20: qty 56

## 2018-07-20 MED ORDER — IBUPROFEN 600 MG PO TABS
600.0000 mg | ORAL_TABLET | Freq: Four times a day (QID) | ORAL | Status: DC
  Administered 2018-07-20 – 2018-07-22 (×7): 600 mg via ORAL
  Filled 2018-07-20 (×7): qty 1

## 2018-07-20 MED ORDER — FENTANYL CITRATE (PF) 100 MCG/2ML IJ SOLN
100.0000 ug | Freq: Once | INTRAMUSCULAR | Status: AC
  Administered 2018-07-20: 100 ug via INTRAMUSCULAR
  Filled 2018-07-20: qty 2

## 2018-07-20 MED ORDER — DIPHENHYDRAMINE HCL 50 MG/ML IJ SOLN: 12.5000 mg | INTRAMUSCULAR | Status: DC | PRN

## 2018-07-20 MED ORDER — FLEET ENEMA 7-19 GM/118ML RE ENEM: 1.0000 | ENEMA | RECTAL | Status: DC | PRN

## 2018-07-20 MED ORDER — PHENYLEPHRINE 40 MCG/ML (10ML) SYRINGE FOR IV PUSH (FOR BLOOD PRESSURE SUPPORT): 80.0000 ug | PREFILLED_SYRINGE | INTRAVENOUS | Status: DC | PRN

## 2018-07-20 MED ORDER — EPHEDRINE 5 MG/ML INJ: 10.0000 mg | INTRAVENOUS | Status: DC | PRN

## 2018-07-20 MED ORDER — EPHEDRINE 5 MG/ML INJ
10.0000 mg | INTRAVENOUS | Status: DC | PRN
  Filled 2018-07-20: qty 2

## 2018-07-20 MED ORDER — WITCH HAZEL-GLYCERIN EX PADS
1.0000 "application " | MEDICATED_PAD | CUTANEOUS | Status: DC | PRN
  Administered 2018-07-20: 1 via TOPICAL

## 2018-07-20 MED ORDER — SIMETHICONE 80 MG PO CHEW: 80.0000 mg | CHEWABLE_TABLET | ORAL | Status: DC | PRN

## 2018-07-20 MED ORDER — DIBUCAINE 1 % RE OINT
1.0000 "application " | TOPICAL_OINTMENT | RECTAL | Status: DC | PRN
  Administered 2018-07-20: 1 via RECTAL
  Filled 2018-07-20: qty 28

## 2018-07-20 MED ORDER — ACETAMINOPHEN 325 MG PO TABS: 650.0000 mg | ORAL_TABLET | ORAL | Status: DC | PRN

## 2018-07-20 MED ORDER — FENTANYL 2.5 MCG/ML BUPIVACAINE 1/10 % EPIDURAL INFUSION (WH - ANES)
14.0000 mL/h | INTRAMUSCULAR | Status: DC | PRN
  Administered 2018-07-20: 14 mL/h via EPIDURAL
  Filled 2018-07-20: qty 100

## 2018-07-20 MED ORDER — LACTATED RINGERS IV SOLN: 500.0000 mL | Freq: Once | INTRAVENOUS | Status: DC

## 2018-07-20 MED ORDER — LACTATED RINGERS IV SOLN
500.0000 mL | Freq: Once | INTRAVENOUS | Status: AC
  Administered 2018-07-20: 500 mL via INTRAVENOUS

## 2018-07-20 MED ORDER — LACTATED RINGERS IV SOLN: 500.0000 mL | INTRAVENOUS | Status: DC | PRN

## 2018-07-20 MED ORDER — LIDOCAINE HCL (PF) 1 % IJ SOLN
30.0000 mL | INTRAMUSCULAR | Status: DC | PRN
  Filled 2018-07-20 (×2): qty 30

## 2018-07-20 MED ORDER — SODIUM CHLORIDE 0.9 % IV SOLN
5.0000 10*6.[IU] | Freq: Once | INTRAVENOUS | Status: AC
  Administered 2018-07-20: 5 10*6.[IU] via INTRAVENOUS
  Filled 2018-07-20: qty 5

## 2018-07-20 MED ORDER — SOD CITRATE-CITRIC ACID 500-334 MG/5ML PO SOLN: 30.0000 mL | ORAL | Status: DC | PRN

## 2018-07-20 MED ORDER — LIDOCAINE HCL (PF) 1 % IJ SOLN
INTRAMUSCULAR | Status: DC | PRN
  Administered 2018-07-20: 5 mL via EPIDURAL
  Administered 2018-07-20: 3 mL via EPIDURAL
  Administered 2018-07-20: 2 mL via EPIDURAL

## 2018-07-20 MED ORDER — PENICILLIN G 3 MILLION UNITS IVPB - SIMPLE MED
3.0000 10*6.[IU] | INTRAVENOUS | Status: DC
  Administered 2018-07-20: 3 10*6.[IU] via INTRAVENOUS
  Filled 2018-07-20 (×4): qty 100

## 2018-07-20 MED ORDER — OXYTOCIN 40 UNITS IN LACTATED RINGERS INFUSION - SIMPLE MED
2.5000 [IU]/h | INTRAVENOUS | Status: DC
  Filled 2018-07-20: qty 1000

## 2018-07-20 MED ORDER — PRENATAL MULTIVITAMIN CH
1.0000 | ORAL_TABLET | Freq: Every day | ORAL | Status: DC
  Administered 2018-07-21: 1 via ORAL
  Filled 2018-07-20: qty 1

## 2018-07-20 MED ORDER — COCONUT OIL OIL: 1.0000 "application " | TOPICAL_OIL | Status: DC | PRN

## 2018-07-20 MED ORDER — SENNOSIDES-DOCUSATE SODIUM 8.6-50 MG PO TABS
2.0000 | ORAL_TABLET | ORAL | Status: DC
  Administered 2018-07-21 (×2): 2 via ORAL
  Filled 2018-07-20 (×2): qty 2

## 2018-07-20 MED ORDER — ZOLPIDEM TARTRATE 5 MG PO TABS: 5.0000 mg | ORAL_TABLET | Freq: Every evening | ORAL | Status: DC | PRN

## 2018-07-20 MED ORDER — MEASLES, MUMPS & RUBELLA VAC ~~LOC~~ INJ
0.5000 mL | INJECTION | Freq: Once | SUBCUTANEOUS | Status: DC
  Filled 2018-07-20: qty 0.5

## 2018-07-20 MED ORDER — ONDANSETRON HCL 4 MG/2ML IJ SOLN: 4.0000 mg | INTRAMUSCULAR | Status: DC | PRN

## 2018-07-20 MED ORDER — OXYCODONE-ACETAMINOPHEN 5-325 MG PO TABS: 2.0000 | ORAL_TABLET | ORAL | Status: DC | PRN

## 2018-07-20 MED ORDER — ONDANSETRON HCL 4 MG PO TABS: 4.0000 mg | ORAL_TABLET | ORAL | Status: DC | PRN

## 2018-07-20 MED ORDER — ONDANSETRON HCL 4 MG/2ML IJ SOLN: 4.0000 mg | Freq: Four times a day (QID) | INTRAMUSCULAR | Status: DC | PRN

## 2018-07-20 MED ORDER — LACTATED RINGERS IV SOLN
INTRAVENOUS | Status: DC
  Administered 2018-07-20 (×2): via INTRAVENOUS

## 2018-07-20 MED ORDER — PHENYLEPHRINE 40 MCG/ML (10ML) SYRINGE FOR IV PUSH (FOR BLOOD PRESSURE SUPPORT)
80.0000 ug | PREFILLED_SYRINGE | INTRAVENOUS | Status: DC | PRN
  Filled 2018-07-20: qty 5

## 2018-07-20 MED ORDER — OXYTOCIN BOLUS FROM INFUSION
500.0000 mL | Freq: Once | INTRAVENOUS | Status: AC
  Administered 2018-07-20: 500 mL via INTRAVENOUS

## 2018-07-20 MED ORDER — PHENYLEPHRINE 40 MCG/ML (10ML) SYRINGE FOR IV PUSH (FOR BLOOD PRESSURE SUPPORT)
80.0000 ug | PREFILLED_SYRINGE | INTRAVENOUS | Status: DC | PRN
  Filled 2018-07-20: qty 10
  Filled 2018-07-20: qty 5

## 2018-07-20 NOTE — Progress Notes (Signed)
Patient ID: Vanessa Allison, female   DOB: [birth year], 27 y.o.   MRN: [id number]  Pt comfortable with epidural BP (!) 89/43   Pulse 91   Temp 98.8 F (37.1 C) (Oral)   Resp 16   Ht 5\' 6"  (1.676 m)   Wt 92.1 kg   LMP 10/07/2017   SpO2 98%   BMI 32.76 kg/m  Cat 1 NST toco q 2-4 minutes cx 7/90/-2 Anticipate SVD

## 2018-07-20 NOTE — H&P (Signed)
Vanessa Allison is a 27 y.o. female presenting for labor. OB History    Gravida  2   Para  1   Term  1   Preterm      AB      Living  1     SAB      TAB      Ectopic      Multiple  0   Live Births  1          Past Medical History:  Diagnosis Date  . Medical history non-contributory    Past Surgical History:  Procedure Laterality Date  . WISDOM TOOTH EXTRACTION  2009   Family History: family history includes Cancer in her paternal grandmother; Diabetes in her maternal grandmother; Heart disease in her maternal grandfather and paternal grandfather; Hypertension in her mother; Other in her mother; Stroke in her paternal grandfather. Social History:  reports that she has never smoked. She has never used smokeless tobacco. She reports that she does not drink alcohol or use drugs.     Maternal Diabetes: No Genetic Screening: Normal Maternal Ultrasounds/Referrals: Normal Fetal Ultrasounds or other Referrals:  None Maternal Substance Abuse:  No Significant Maternal Medications:  None Significant Maternal Lab Results:  None Other Comments:  None  ROS History  Physical Examination: General appearance - alert, well appearing, and in no distress Heart - normal rate and regular rhythm Abdomen - soft, nontender, nondistended, no masses or organomegaly Extremities - Homan's sign negative bilaterally Dilation: 7 Effacement (%): 90 Station: -1 Exam by:: J.Follmer,RNC Blood pressure (!) 89/43, pulse 91, temperature 98.8 F (37.1 C), temperature source Oral, resp. rate 16, height 5\' 6"  (1.676 m), weight 92.1 kg, last menstrual period 10/07/2017, SpO2 98 %, unknown if currently breastfeeding. Exam Physical Exam  Prenatal labs: ABO, Rh: --/--/A POS (10/19 1610) Antibody: NEG (10/19 0951) Rubella: Immune (03/13 0000) RPR: Nonreactive (03/13 0000)  HBsAg: Negative (03/13 0000)  HIV: Non-reactive (03/13 0000)  GBS: Positive (09/19 0000)   Assessment/Plan: Term in  Labor PCN started for GBS Cat 1 tracing Anticipate SVD   Miyuki Rzasa A 07/20/2018, 1:12 PM

## 2018-07-20 NOTE — Anesthesia Preprocedure Evaluation (Signed)
Anesthesia Evaluation  Patient identified by MRN, date of birth, ID band Patient awake    Reviewed: Allergy & Precautions, Patient's Chart, lab work & pertinent test results  Airway Mallampati: II  TM Distance: >3 FB     Dental   Pulmonary neg pulmonary ROS,    Pulmonary exam normal        Cardiovascular negative cardio ROS Normal cardiovascular exam     Neuro/Psych negative neurological ROS     GI/Hepatic negative GI ROS, Neg liver ROS,   Endo/Other  negative endocrine ROS  Renal/GU negative Renal ROS     Musculoskeletal   Abdominal   Peds  Hematology negative hematology ROS (+)   Anesthesia Other Findings   Reproductive/Obstetrics (+) Pregnancy                             Lab Results  Component Value Date   WBC 12.5 (H) 07/20/2018   HGB 12.7 07/20/2018   HCT 38.1 07/20/2018   MCV 94.3 07/20/2018   PLT 232 07/20/2018    Anesthesia Physical Anesthesia Plan  ASA: II  Anesthesia Plan: Epidural   Post-op Pain Management:    Induction:   PONV Risk Score and Plan: Treatment may vary due to age or medical condition  Airway Management Planned: Natural Airway  Additional Equipment:   Intra-op Plan:   Post-operative Plan:   Informed Consent: I have reviewed the patients History and Physical, chart, labs and discussed the procedure including the risks, benefits and alternatives for the proposed anesthesia with the patient or authorized representative who has indicated his/her understanding and acceptance.     Plan Discussed with:   Anesthesia Plan Comments:         Anesthesia Quick Evaluation

## 2018-07-20 NOTE — MAU Note (Addendum)
Pt was seen MAU Friday evening for labor eval and sent home. Returns with stronger ctxs. Denies LOF or bleeding. 3.5cm last sve

## 2018-07-20 NOTE — Anesthesia Procedure Notes (Signed)
Epidural Patient location during procedure: OB Start time: 07/20/2018 10:32 AM End time: 07/20/2018 10:38 AM  Staffing Anesthesiologist: Marcene Duos, MD Performed: anesthesiologist   Preanesthetic Checklist Completed: patient identified, site marked, surgical consent, pre-op evaluation, timeout performed, IV checked, risks and benefits discussed and monitors and equipment checked  Epidural Patient position: sitting Prep: site prepped and draped and DuraPrep Patient monitoring: continuous pulse ox and blood pressure Approach: midline Location: L4-L5 Injection technique: LOR air  Needle:  Needle type: Tuohy  Needle gauge: 17 G Needle length: 9 cm and 9 Needle insertion depth: 6 cm Catheter type: closed end flexible Catheter size: 19 Gauge Catheter at skin depth: 11 cm Test dose: negative  Assessment Events: blood not aspirated, injection not painful, no injection resistance, negative IV test and no paresthesia

## 2018-07-21 LAB — CBC
HCT: 30.3 % — ABNORMAL LOW (ref 36.0–46.0)
Hemoglobin: 10.2 g/dL — ABNORMAL LOW (ref 12.0–15.0)
MCH: 32 pg (ref 26.0–34.0)
MCHC: 33.7 g/dL (ref 30.0–36.0)
MCV: 95 fL (ref 80.0–100.0)
Platelets: 191 10*3/uL (ref 150–400)
RBC: 3.19 MIL/uL — ABNORMAL LOW (ref 3.87–5.11)
RDW: 13.9 % (ref 11.5–15.5)
WBC: 13.6 10*3/uL — ABNORMAL HIGH (ref 4.0–10.5)
nRBC: 0 % (ref 0.0–0.2)

## 2018-07-21 LAB — RPR: RPR: NONREACTIVE

## 2018-07-21 NOTE — Progress Notes (Addendum)
[facility]   Subjective: Postpartum Day # 1 : S/P NSVD due to spontaneous active labor. Patient up ad lib, denies syncope or dizziness. Reports consuming regular diet without issues and denies N/V. Patient reports 0. bowel movement + passing flatus.  Denies issues with urination and reports bleeding is "light."  Patient is Breastfeeding and reports going well.  Desires Pills for postpartum contraception.  Pain is being appropriately managed with use of motrin.   None laceration Feeding:  Breast Contraceptive plan:  Pills Baby Female.   Objective: Vital signs in last 24 hours: Patient Vitals for the past 24 hrs:  BP Temp Temp src Pulse Resp SpO2  07/21/18 0551 107/75 97.7 F (36.5 C) Oral 88 18 -  07/21/18 0200 108/72 98.1 F (36.7 C) Oral 71 18 -  07/20/18 2115 (!) 102/56 98 F (36.7 C) Oral 100 18 -  07/20/18 1819 120/71 98.9 F (37.2 C) - 82 20 -  07/20/18 1700 120/77 98.2 F (36.8 C) - 79 20 -  07/20/18 1631 124/74 - - 75 16 -  07/20/18 1615 132/75 - - 69 16 -  07/20/18 1601 (!) 112/96 - - 70 16 -  07/20/18 1545 119/62 - - 77 16 -  07/20/18 1531 123/72 - - 78 16 -  07/20/18 1501 111/87 - - 94 16 -  07/20/18 1431 112/66 - - 88 16 -  07/20/18 1401 108/67 - - 88 18 -  07/20/18 1331 111/69 98.1 F (36.7 C) Oral 84 16 -  07/20/18 1301 109/62 - - 100 16 -  07/20/18 1231 110/62 - - 77 16 -  07/20/18 1201 (!) 89/43 - - 91 16 -  07/20/18 1131 (!) 99/45 - - 90 16 -  07/20/18 1121 (!) 96/43 - - (!) 102 16 -  07/20/18 1111 (!) 90/47 - - 99 16 -  07/20/18 1106 112/67 - - 84 16 -  07/20/18 1101 117/67 - - 85 16 -  07/20/18 1056 118/69 - - 85 18 -  07/20/18 1055 - - - - - 98 %  07/20/18 1051 111/70 - - 98 18 99 %  07/20/18 1050 111/70 - - 98 18 99 %  07/20/18 1046 111/76 - - 92 18 99 %  07/20/18 1041 117/79 - - 81 18 100 %  07/20/18 1039 123/69 - - 87 18 -  07/20/18 1038 123/69 - - 87 18 100 %  07/20/18 1037 - - - - - 100 %  07/20/18 1036 - - - - - 100 %      Physical Exam:  General: alert, cooperative, appears stated age and no distress Mood/Affect: happy Lungs: clear to auscultation, no wheezes, rales or rhonchi, symmetric air entry.  Heart: normal rate, regular rhythm, normal S1, S2, no murmurs, rubs, clicks or gallops. Breast: breasts appear normal, no suspicious masses, no skin or nipple changes or axillary nodes. Abdomen:  + bowel sounds, soft, non-tender GU: perineum intact, healing well. No signs of external hematomas.  Uterine Fundus: firm Lochia: appropriate Skin: Warm, Dry. DVT Evaluation: No evidence of DVT seen on physical exam. Negative Homan's sign. No cords or calf tenderness. No significant calf/ankle edema.  CBC Latest Ref Rng & Units 07/21/2018 07/20/2018 01/24/2017  WBC 4.0 - 10.5 K/uL 13.6(H) 12.5(H) 20.8(H)  Hemoglobin 12.0 - 15.0 g/dL 10.2(L) 12.7 12.1  Hematocrit 36.0 - 46.0 % 30.3(L) 38.1 35.1(L)  Platelets 150 - 400 K/uL 191 232 175    Results for orders placed or performed during  the hospital encounter of 07/20/18 (from the past 24 hour(s))  CBC     Status: Abnormal   Collection Time: 07/21/18  4:22 AM  Result Value Ref Range   WBC 13.6 (H) 4.0 - 10.5 K/uL   RBC 3.19 (L) 3.87 - 5.11 MIL/uL   Hemoglobin 10.2 (L) 12.0 - 15.0 g/dL   HCT 16.1 (L) 09.6 - 04.5 %   MCV 95.0 80.0 - 100.0 fL   MCH 32.0 26.0 - 34.0 pg   MCHC 33.7 30.0 - 36.0 g/dL   RDW 40.9 81.1 - 91.4 %   Platelets 191 150 - 400 K/uL   nRBC 0.0 0.0 - 0.2 %     CBG (last 3)  No results for input(s): GLUCAP in the last 72 hours.   I/O last 3 completed shifts: In: -  Out: 500 [Urine:300; Blood:200]   Assessment Postpartum Day # 1 : S/P NSVD due to spontaneous labor. Pt stable. -2 involution. breastfeeding. Hemodynamically stable. Post deliver HBG 10.2.  Plan: Continue other mgmt as ordered VTE prophylactics: Early ambulated as tolerates.  Pain control: Motrin/Tylenol PRN Education given regarding options for contraception, including  barrier methods, injectable contraception, IUD placement, oral contraceptives.  Plan for discharge tomorrow, Breastfeeding, Lactation consult and Contraception Pills  Dr. Normand Sloop to be updated on patient status  Valyn Latchford NP-C, CNM 07/21/2018, 10:02 AM

## 2018-07-21 NOTE — Anesthesia Postprocedure Evaluation (Signed)
Anesthesia Post Note  Patient: Vanessa Allison  Procedure(s) Performed: AN AD HOC LABOR EPIDURAL     Patient location during evaluation: Mother Baby Anesthesia Type: Epidural Level of consciousness: awake and alert, oriented and patient cooperative Pain management: pain level controlled Vital Signs Assessment: post-procedure vital signs reviewed and stable Respiratory status: spontaneous breathing Cardiovascular status: stable Postop Assessment: no headache, epidural receding, patient able to bend at knees and no signs of nausea or vomiting Anesthetic complications: no Comments: Pain score 1.    Last Vitals:  Vitals:   07/21/18 0200 07/21/18 0551  BP: 108/72 107/75  Pulse: 71 88  Resp: 18 18  Temp: 36.7 C 36.5 C  SpO2:      Last Pain:  Vitals:   07/21/18 0817  TempSrc:   PainSc: 0-No pain   Pain Goal: Patients Stated Pain Goal: 2 (07/20/18 0941)               Merrilyn Puma

## 2018-07-21 NOTE — Lactation Note (Signed)
This note was copied from a baby's chart. Lactation Consultation Note  Patient Name: Vanessa Allison [id number] Date: 07/21/2018 Reason for consult: Initial assessment  Initial visit at 29 hours of life. Mom is a P2 who nursed her 1st child for 8 months (now 8 months old). Mom says her milk has already come to volume in 1 breast. She denies a need for a pump.  Mom was made aware of O/P services, breastfeeding support groups, community resources, and our phone # for post-discharge questions.   Mom declines further lactation assist.   [name] [facility] 07/21/2018, 8:17 PM

## 2018-07-22 ENCOUNTER — Inpatient Hospital Stay (HOSPITAL_COMMUNITY): Admission: RE | Admit: 2018-07-22 | Payer: Self-pay | Source: Ambulatory Visit

## 2018-07-22 MED ORDER — IBUPROFEN 600 MG PO TABS: 600.0000 mg | ORAL_TABLET | Freq: Four times a day (QID) | ORAL | 0 refills | Status: DC | PRN

## 2018-07-22 NOTE — Discharge Summary (Signed)
OB Discharge Summary     Patient Name: Vanessa Allison DOB: [birth year] MRN: [id number]  Date of admission: 07/20/2018 Delivering MD: Jaymes Graff   Date of discharge: 07/22/2018  Admitting diagnosis: 40wks ctxs 3 to Intrauterine pregnancy: [redacted]w[redacted]d     Secondary diagnosis:  Active Problems:   Normal labor and delivery  Additional problems: None     Discharge diagnosis: Term Pregnancy Delivered                                                                                                Post partum procedures:None  Augmentation: None  Complications: None  Hospital course:  Onset of Labor With Vaginal Delivery     27 y.o. yo [id number] at [redacted]w[redacted]d was admitted in Active Labor on 07/20/2018. Patient had an uncomplicated labor course as follows:  Membrane Rupture Time/Date: 11:31 AM ,07/20/2018   Intrapartum Procedures: Episiotomy: None [1]                                         Lacerations:  None [1]  Patient had a delivery of a Viable infant. 07/20/2018  Information for the patient's newborn:  Lawren, Sexson Girl Zaelynn [[id number]]  Delivery Method: Vaginal, Spontaneous(Filed from Delivery Summary)    Pateint had an uncomplicated postpartum course.  She is ambulating, tolerating a regular diet, passing flatus, and urinating well. Patient is discharged home in stable condition on 07/22/18.   Physical exam  Vitals:   07/21/18 0200 07/21/18 0551 07/21/18 1411 07/22/18 0605  BP: 108/72 107/75 97/78 114/77  Pulse: 71 88 84 64  Resp: 18 18 16 16   Temp: 98.1 F (36.7 C) 97.7 F (36.5 C) 98.1 F (36.7 C) 97.7 F (36.5 C)  TempSrc: Oral Oral Oral Oral  SpO2:   98%   Weight:      Height:       General: alert, cooperative and no distress Lochia: appropriate Uterine Fundus: firm Incision: N/A DVT Evaluation: No evidence of DVT seen on physical exam. Labs: Lab Results  Component Value Date   WBC 13.6 (H) 07/21/2018   HGB 10.2 (L) 07/21/2018   HCT 30.3 (L) 07/21/2018   MCV 95.0 07/21/2018   PLT 191 07/21/2018   No flowsheet data found.  Discharge instruction: per After Visit Summary and "Baby and Me Booklet".  After visit meds:  Allergies as of 07/22/2018   No Known Allergies     Medication List    TAKE these medications   acetaminophen 325 MG tablet Commonly known as:  TYLENOL Take 650 mg by mouth every 6 (six) hours as needed for moderate pain.   calcium carbonate 500 MG chewable tablet Commonly known as:  TUMS - dosed in mg elemental calcium Chew 1 tablet by mouth daily.   ibuprofen 600 MG tablet Commonly known as:  ADVIL,MOTRIN Take 1 tablet (600 mg total) by mouth every 6 (six) hours as needed.   multivitamin-prenatal 27-0.8 MG Tabs tablet Take 1 tablet by mouth daily at 12 noon.  triamcinolone cream 0.1 % Commonly known as:  KENALOG Apply topically daily as needed (eczema).       Diet: routine diet  Activity: Advance as tolerated. Pelvic rest for 6 weeks.   Outpatient follow up:6 weeks Follow up Appt:No future appointments. Follow up Visit:No follow-ups on file.  Postpartum contraception: Not Discussed  Newborn Data: Live born female  Birth Weight: 9 lb 1.2 oz (4115 g) APGAR: 8, 9  Newborn Delivery   Birth date/time:  07/20/2018 14:46:00 Delivery type:  Vaginal, Spontaneous     Baby Feeding: Breast Disposition:home with mother   07/22/2018 Gerald Leitz, MD

## 2020-08-23 ENCOUNTER — Encounter (HOSPITAL_COMMUNITY): Payer: Self-pay | Admitting: *Deleted

## 2020-08-23 NOTE — Patient Instructions (Signed)
Vanessa Allison  08/23/2020   Your procedure is scheduled on:  09/06/2020  Arrive at 1030 at Entrance C on [address] at [facility]  and [employer]. You are invited to use the FREE valet parking or use the Visitor's parking deck.  Pick up the phone at the desk and dial [phone].  Call this number if you have problems the morning of surgery: [phone]  Remember:   Do not eat food:(After Midnight) Desps de medianoche.  Do not drink clear liquids: (After Midnight) Desps de medianoche.  Take these medicines the morning of surgery with A SIP OF WATER:  none   Do not wear jewelry, make-up or nail polish.  Do not wear lotions, powders, or perfumes. Do not wear deodorant.  Do not shave 48 hours prior to surgery.  Do not bring valuables to the hospital.  [facility] is not   responsible for any belongings or valuables brought to the hospital.  Contacts, dentures or bridgework may not be worn into surgery.  Leave suitcase in the car. After surgery it may be brought to your room.  For patients admitted to the hospital, checkout time is 11:00 AM the day of              discharge.      Please read over the following fact sheets that you were given:     Preparing for Surgery

## 2020-09-03 ENCOUNTER — Other Ambulatory Visit: Payer: Self-pay

## 2020-09-03 ENCOUNTER — Other Ambulatory Visit (HOSPITAL_COMMUNITY)
Admission: RE | Admit: 2020-09-03 | Discharge: 2020-09-03 | Disposition: A | Discharge status: Home / Self Care | Payer: HRSA Program | Source: Ambulatory Visit | Attending: Obstetrics and Gynecology | Admitting: Obstetrics and Gynecology

## 2020-09-03 ENCOUNTER — Encounter (HOSPITAL_COMMUNITY)
Admission: RE | Admit: 2020-09-03 | Discharge: 2020-09-03 | Disposition: A | Discharge status: Home / Self Care | Payer: HRSA Program | Source: Ambulatory Visit | Attending: Obstetrics and Gynecology | Admitting: Obstetrics and Gynecology

## 2020-09-03 DIAGNOSIS — Z20822 Contact with and (suspected) exposure to covid-19: Secondary | ICD-10-CM | POA: Diagnosis not present

## 2020-09-03 DIAGNOSIS — Z01812 Encounter for preprocedural laboratory examination: Secondary | ICD-10-CM | POA: Insufficient documentation

## 2020-09-03 HISTORY — DX: Complete placenta previa nos or without hemorrhage, unspecified trimester: O44.00

## 2020-09-03 LAB — CBC
HCT: 34.8 % — ABNORMAL LOW (ref 36.0–46.0)
Hemoglobin: 12 g/dL (ref 12.0–15.0)
MCH: 32.4 pg (ref 26.0–34.0)
MCHC: 34.5 g/dL (ref 30.0–36.0)
MCV: 94.1 fL (ref 80.0–100.0)
Platelets: 278 10*3/uL (ref 150–400)
RBC: 3.7 MIL/uL — ABNORMAL LOW (ref 3.87–5.11)
RDW: 13.2 % (ref 11.5–15.5)
WBC: 9.3 10*3/uL (ref 4.0–10.5)
nRBC: 0 % (ref 0.0–0.2)

## 2020-09-03 LAB — SARS CORONAVIRUS 2 (TAT 6-24 HRS): SARS Coronavirus 2: NEGATIVE

## 2020-09-03 LAB — RPR: RPR Ser Ql: NONREACTIVE

## 2020-09-03 LAB — TYPE AND SCREEN
ABO/RH(D): A POS
Antibody Screen: NEGATIVE

## 2020-09-05 ENCOUNTER — Other Ambulatory Visit: Payer: Self-pay | Admitting: Obstetrics and Gynecology

## 2020-09-05 NOTE — H&P (Signed)
Vanessa Allison is a 29 y.o. female  G4 P2012 at 37 weeks and 0 days presenting for primary cesarean section due to placenta previa . Pregnancy also complicated by malpresentation. Transverse head maternal left and spine superior on 08/31/2020. Prenatal care provided by Dr. Gerald Leitz with [facility].  . OB History    Gravida  4   Para  2   Term  2   Preterm      AB  1   Living  2     SAB      TAB  1   Ectopic      Multiple  0   Live Births  2          Past Medical History:  Diagnosis Date  . Medical history non-contributory   . Placenta previa    Past Surgical History:  Procedure Laterality Date  . NO PAST SURGERIES    . WISDOM TOOTH EXTRACTION  2009   Family History: family history includes Cancer in her paternal grandmother; Diabetes in her maternal grandmother; Heart disease in her maternal grandfather and paternal grandfather; Hypertension in her mother; Other in her mother; Stroke in her paternal grandfather. Social History:  reports that she has never smoked. She has never used smokeless tobacco. She reports that she does not drink alcohol and does not use drugs.     Maternal Diabetes: No Genetic Screening: Declined Maternal Ultrasounds/Referrals: Normal Fetal Ultrasounds or other Referrals:  None Maternal Substance Abuse:  No Significant Maternal Medications:  None Significant Maternal Lab Results:  Group B Strep negative Other Comments:  None  Review of Systems  Constitutional: Negative.   HENT: Negative.   Eyes: Negative.   Respiratory: Negative.   Cardiovascular: Negative.   Gastrointestinal: Negative.   Endocrine: Negative.   Genitourinary: Negative.   Musculoskeletal: Negative.   Skin: Negative.   Allergic/Immunologic: Negative.   Neurological: Negative.   Hematological: Negative.   Psychiatric/Behavioral: Negative.    History   unknown if currently breastfeeding. Maternal Exam:  Introitus: Normal vulva.   Physical Exam Vitals  reviewed.  Constitutional:      Appearance: Normal appearance.  HENT:     Head: Normocephalic and atraumatic.     Nose: Nose normal.  Cardiovascular:     Rate and Rhythm: Normal rate and regular rhythm.  Pulmonary:     Effort: Pulmonary effort is normal.     Breath sounds: Normal breath sounds.  Abdominal:     Tenderness: There is no abdominal tenderness.  Genitourinary:    General: Normal vulva.  Musculoskeletal:        General: Normal range of motion.     Cervical back: Normal range of motion.  Skin:    General: Skin is warm.  Neurological:     General: No focal deficit present.     Mental Status: She is alert.  Psychiatric:        Mood and Affect: Mood normal.        Behavior: Behavior normal.     Prenatal labs: ABO, Rh: --/--/A POS (12/03 1610) Antibody: NEG (12/03 0942) Rubella:  Immune  RPR: NON REACTIVE (12/03 0942)  HBsAg:   Negative  HIV:   Negative  GBS:   Negative   Assessment/Plan: 37 weeks and 0 days with placenta previa ( posterior) and malpresentation for primary cesarean section. . R/B/A of cesarean section discussed with the patient including but not limited to infection, bleeding damage to bowel bladder and baby with the need for  further surgery. R/O transfusion HIV/ Hep B&C discussed. Pt voiced understanding and desires to proceed with cesarean section.    Gerald Leitz 09/05/2020, 10:44 AM

## 2020-09-05 NOTE — H&P (Deleted)
  The note originally documented on this encounter has been moved the the encounter in which it belongs.  

## 2020-09-06 ENCOUNTER — Inpatient Hospital Stay (HOSPITAL_COMMUNITY): Payer: Self-pay | Admitting: Certified Registered Nurse Anesthetist

## 2020-09-06 ENCOUNTER — Other Ambulatory Visit: Payer: Self-pay

## 2020-09-06 ENCOUNTER — Encounter (HOSPITAL_COMMUNITY): Payer: Self-pay | Admitting: Obstetrics and Gynecology

## 2020-09-06 ENCOUNTER — Inpatient Hospital Stay (HOSPITAL_COMMUNITY)
Admission: RE | Admit: 2020-09-06 | Discharge: 2020-09-08 | DRG: 788 | Disposition: A | Discharge status: Home / Self Care | Payer: Self-pay | Attending: Obstetrics and Gynecology | Admitting: Obstetrics and Gynecology

## 2020-09-06 ENCOUNTER — Encounter (HOSPITAL_COMMUNITY)
Admission: RE | Disposition: A | Discharge status: Home / Self Care | Payer: Self-pay | Source: Home / Self Care | Attending: Obstetrics and Gynecology

## 2020-09-06 DIAGNOSIS — O4403 Placenta previa specified as without hemorrhage, third trimester: Principal | ICD-10-CM | POA: Diagnosis present

## 2020-09-06 DIAGNOSIS — O4400 Placenta previa specified as without hemorrhage, unspecified trimester: Secondary | ICD-10-CM | POA: Diagnosis present

## 2020-09-06 DIAGNOSIS — Z3A37 37 weeks gestation of pregnancy: Secondary | ICD-10-CM

## 2020-09-06 DIAGNOSIS — O322XX0 Maternal care for transverse and oblique lie, not applicable or unspecified: Secondary | ICD-10-CM | POA: Diagnosis present

## 2020-09-06 SURGERY — Surgical Case
Anesthesia: Spinal

## 2020-09-06 MED ORDER — DIPHENHYDRAMINE HCL 25 MG PO CAPS: 25.0000 mg | ORAL_CAPSULE | Freq: Four times a day (QID) | ORAL | Status: DC | PRN

## 2020-09-06 MED ORDER — FENTANYL CITRATE (PF) 100 MCG/2ML IJ SOLN
INTRAMUSCULAR | Status: DC | PRN
  Administered 2020-09-06: 15 ug via INTRATHECAL

## 2020-09-06 MED ORDER — PROMETHAZINE HCL 25 MG/ML IJ SOLN: 12.5000 mg | Freq: Four times a day (QID) | INTRAMUSCULAR | Status: DC | PRN

## 2020-09-06 MED ORDER — METHYLERGONOVINE MALEATE 0.2 MG PO TABS: 0.2000 mg | ORAL_TABLET | ORAL | Status: DC | PRN

## 2020-09-06 MED ORDER — MENTHOL 3 MG MT LOZG: 1.0000 | LOZENGE | OROMUCOSAL | Status: DC | PRN

## 2020-09-06 MED ORDER — HYDROMORPHONE HCL 1 MG/ML IJ SOLN: 0.2000 mg | INTRAMUSCULAR | Status: DC | PRN

## 2020-09-06 MED ORDER — MORPHINE SULFATE (PF) 0.5 MG/ML IJ SOLN
INTRAMUSCULAR | Status: DC | PRN
  Administered 2020-09-06: .15 mg via INTRATHECAL

## 2020-09-06 MED ORDER — SCOPOLAMINE 1 MG/3DAYS TD PT72
MEDICATED_PATCH | TRANSDERMAL | Status: AC
  Filled 2020-09-06: qty 1

## 2020-09-06 MED ORDER — POVIDONE-IODINE 10 % EX SWAB
2.0000 "application " | Freq: Once | CUTANEOUS | Status: AC
  Administered 2020-09-06: 2 via TOPICAL

## 2020-09-06 MED ORDER — NALBUPHINE HCL 10 MG/ML IJ SOLN: 5.0000 mg | Freq: Once | INTRAMUSCULAR | Status: DC | PRN

## 2020-09-06 MED ORDER — CEFAZOLIN SODIUM-DEXTROSE 2-4 GM/100ML-% IV SOLN
2.0000 g | INTRAVENOUS | Status: AC
  Administered 2020-09-06: 2 g via INTRAVENOUS

## 2020-09-06 MED ORDER — ONDANSETRON HCL 4 MG/2ML IJ SOLN: 4.0000 mg | Freq: Once | INTRAMUSCULAR | Status: DC | PRN

## 2020-09-06 MED ORDER — DIPHENHYDRAMINE HCL 50 MG/ML IJ SOLN: 12.5000 mg | INTRAMUSCULAR | Status: DC | PRN

## 2020-09-06 MED ORDER — SODIUM CHLORIDE 0.9% FLUSH: 3.0000 mL | INTRAVENOUS | Status: DC | PRN

## 2020-09-06 MED ORDER — SOD CITRATE-CITRIC ACID 500-334 MG/5ML PO SOLN
30.0000 mL | ORAL | Status: AC
  Administered 2020-09-06: 30 mL via ORAL

## 2020-09-06 MED ORDER — ONDANSETRON HCL 4 MG/2ML IJ SOLN
4.0000 mg | Freq: Three times a day (TID) | INTRAMUSCULAR | Status: DC | PRN
  Filled 2020-09-06: qty 2

## 2020-09-06 MED ORDER — OXYTOCIN-SODIUM CHLORIDE 30-0.9 UT/500ML-% IV SOLN
INTRAVENOUS | Status: AC
  Filled 2020-09-06: qty 500

## 2020-09-06 MED ORDER — DIBUCAINE (PERIANAL) 1 % EX OINT: 1.0000 "application " | TOPICAL_OINTMENT | CUTANEOUS | Status: DC | PRN

## 2020-09-06 MED ORDER — SCOPOLAMINE 1 MG/3DAYS TD PT72
1.0000 | MEDICATED_PATCH | Freq: Once | TRANSDERMAL | Status: DC
  Administered 2020-09-06: 1.5 mg via TRANSDERMAL

## 2020-09-06 MED ORDER — KETOROLAC TROMETHAMINE 30 MG/ML IJ SOLN
30.0000 mg | Freq: Once | INTRAMUSCULAR | Status: AC | PRN
  Administered 2020-09-06: 30 mg via INTRAVENOUS

## 2020-09-06 MED ORDER — PHENYLEPHRINE HCL-NACL 20-0.9 MG/250ML-% IV SOLN
INTRAVENOUS | Status: AC
  Filled 2020-09-06: qty 250

## 2020-09-06 MED ORDER — STERILE WATER FOR IRRIGATION IR SOLN
Status: DC | PRN
  Administered 2020-09-06: 1

## 2020-09-06 MED ORDER — LACTATED RINGERS IV BOLUS
500.0000 mL | Freq: Once | INTRAVENOUS | Status: AC
  Administered 2020-09-06: 500 mL via INTRAVENOUS

## 2020-09-06 MED ORDER — SODIUM CHLORIDE 0.9 % IV SOLN: INTRAVENOUS | Status: DC | PRN

## 2020-09-06 MED ORDER — ACETAMINOPHEN 325 MG PO TABS
650.0000 mg | ORAL_TABLET | ORAL | Status: DC | PRN
  Administered 2020-09-06 – 2020-09-07 (×5): 650 mg via ORAL
  Filled 2020-09-06 (×5): qty 2

## 2020-09-06 MED ORDER — FENTANYL CITRATE (PF) 100 MCG/2ML IJ SOLN
INTRAMUSCULAR | Status: AC
  Filled 2020-09-06: qty 2

## 2020-09-06 MED ORDER — DEXAMETHASONE SODIUM PHOSPHATE 10 MG/ML IJ SOLN
INTRAMUSCULAR | Status: AC
  Filled 2020-09-06: qty 1

## 2020-09-06 MED ORDER — CEFAZOLIN SODIUM-DEXTROSE 2-4 GM/100ML-% IV SOLN
INTRAVENOUS | Status: AC
  Filled 2020-09-06: qty 100

## 2020-09-06 MED ORDER — COCONUT OIL OIL: 1.0000 "application " | TOPICAL_OIL | Status: DC | PRN

## 2020-09-06 MED ORDER — LACTATED RINGERS IV SOLN: INTRAVENOUS | Status: DC

## 2020-09-06 MED ORDER — DIPHENHYDRAMINE HCL 25 MG PO CAPS: 25.0000 mg | ORAL_CAPSULE | ORAL | Status: DC | PRN

## 2020-09-06 MED ORDER — WITCH HAZEL-GLYCERIN EX PADS: 1.0000 "application " | MEDICATED_PAD | CUTANEOUS | Status: DC | PRN

## 2020-09-06 MED ORDER — OXYCODONE HCL 5 MG PO TABS: 5.0000 mg | ORAL_TABLET | ORAL | Status: DC | PRN

## 2020-09-06 MED ORDER — SODIUM CHLORIDE 0.9 % IR SOLN
Status: DC | PRN
  Administered 2020-09-06: 1

## 2020-09-06 MED ORDER — OXYTOCIN-SODIUM CHLORIDE 30-0.9 UT/500ML-% IV SOLN
INTRAVENOUS | Status: DC | PRN
  Administered 2020-09-06: 30 [IU] via INTRAVENOUS

## 2020-09-06 MED ORDER — SIMETHICONE 80 MG PO CHEW: 80.0000 mg | CHEWABLE_TABLET | ORAL | Status: DC | PRN

## 2020-09-06 MED ORDER — DEXAMETHASONE SODIUM PHOSPHATE 10 MG/ML IJ SOLN
INTRAMUSCULAR | Status: DC | PRN
  Administered 2020-09-06: 10 mg via INTRAVENOUS

## 2020-09-06 MED ORDER — SIMETHICONE 80 MG PO CHEW
80.0000 mg | CHEWABLE_TABLET | ORAL | Status: DC
  Administered 2020-09-06 – 2020-09-07 (×2): 80 mg via ORAL
  Filled 2020-09-06 (×2): qty 1

## 2020-09-06 MED ORDER — ONDANSETRON HCL 4 MG/2ML IJ SOLN
INTRAMUSCULAR | Status: AC
  Filled 2020-09-06: qty 2

## 2020-09-06 MED ORDER — PHENYLEPHRINE HCL-NACL 20-0.9 MG/250ML-% IV SOLN
INTRAVENOUS | Status: DC | PRN
  Administered 2020-09-06: 60 ug/min via INTRAVENOUS

## 2020-09-06 MED ORDER — KETOROLAC TROMETHAMINE 30 MG/ML IJ SOLN
INTRAMUSCULAR | Status: AC
  Filled 2020-09-06: qty 1

## 2020-09-06 MED ORDER — IBUPROFEN 800 MG PO TABS
800.0000 mg | ORAL_TABLET | Freq: Three times a day (TID) | ORAL | Status: DC
  Administered 2020-09-06 – 2020-09-08 (×6): 800 mg via ORAL
  Filled 2020-09-06 (×6): qty 1

## 2020-09-06 MED ORDER — OXYTOCIN-SODIUM CHLORIDE 30-0.9 UT/500ML-% IV SOLN
2.5000 [IU]/h | INTRAVENOUS | Status: AC
  Administered 2020-09-06: 2.5 [IU]/h via INTRAVENOUS

## 2020-09-06 MED ORDER — METHYLERGONOVINE MALEATE 0.2 MG/ML IJ SOLN: 0.2000 mg | INTRAMUSCULAR | Status: DC | PRN

## 2020-09-06 MED ORDER — NALBUPHINE HCL 10 MG/ML IJ SOLN: 5.0000 mg | INTRAMUSCULAR | Status: DC | PRN

## 2020-09-06 MED ORDER — BUPIVACAINE IN DEXTROSE 0.75-8.25 % IT SOLN
INTRATHECAL | Status: DC | PRN
  Administered 2020-09-06: 1.5 mL via INTRATHECAL

## 2020-09-06 MED ORDER — ONDANSETRON HCL 4 MG/2ML IJ SOLN
4.0000 mg | Freq: Once | INTRAMUSCULAR | Status: AC
  Administered 2020-09-06: 4 mg via INTRAVENOUS

## 2020-09-06 MED ORDER — NALOXONE HCL 4 MG/10ML IJ SOLN
1.0000 ug/kg/h | INTRAVENOUS | Status: DC | PRN
  Filled 2020-09-06: qty 5

## 2020-09-06 MED ORDER — SIMETHICONE 80 MG PO CHEW
80.0000 mg | CHEWABLE_TABLET | Freq: Three times a day (TID) | ORAL | Status: DC
  Administered 2020-09-07 – 2020-09-08 (×6): 80 mg via ORAL
  Filled 2020-09-06 (×6): qty 1

## 2020-09-06 MED ORDER — MEPERIDINE HCL 25 MG/ML IJ SOLN: 6.2500 mg | INTRAMUSCULAR | Status: DC | PRN

## 2020-09-06 MED ORDER — ONDANSETRON HCL 4 MG/2ML IJ SOLN
INTRAMUSCULAR | Status: DC | PRN
  Administered 2020-09-06: 4 mg via INTRAVENOUS

## 2020-09-06 MED ORDER — FERROUS SULFATE 325 (65 FE) MG PO TABS
325.0000 mg | ORAL_TABLET | Freq: Two times a day (BID) | ORAL | Status: DC
  Administered 2020-09-07 – 2020-09-08 (×4): 325 mg via ORAL
  Filled 2020-09-06 (×4): qty 1

## 2020-09-06 MED ORDER — HYDROMORPHONE HCL 1 MG/ML IJ SOLN: 0.2500 mg | INTRAMUSCULAR | Status: DC | PRN

## 2020-09-06 MED ORDER — PRENATAL MULTIVITAMIN CH
1.0000 | ORAL_TABLET | Freq: Every day | ORAL | Status: DC
  Administered 2020-09-07 – 2020-09-08 (×2): 1 via ORAL
  Filled 2020-09-06 (×2): qty 1

## 2020-09-06 MED ORDER — MORPHINE SULFATE (PF) 0.5 MG/ML IJ SOLN
INTRAMUSCULAR | Status: AC
  Filled 2020-09-06: qty 10

## 2020-09-06 MED ORDER — ZOLPIDEM TARTRATE 5 MG PO TABS: 5.0000 mg | ORAL_TABLET | Freq: Every evening | ORAL | Status: DC | PRN

## 2020-09-06 MED ORDER — NALOXONE HCL 0.4 MG/ML IJ SOLN: 0.4000 mg | INTRAMUSCULAR | Status: DC | PRN

## 2020-09-06 MED ORDER — SENNOSIDES-DOCUSATE SODIUM 8.6-50 MG PO TABS
2.0000 | ORAL_TABLET | ORAL | Status: DC
  Administered 2020-09-06 – 2020-09-07 (×2): 2 via ORAL
  Filled 2020-09-06 (×2): qty 2

## 2020-09-06 SURGICAL SUPPLY — 43 items
APL SKNCLS STERI-STRIP NONHPOA (GAUZE/BANDAGES/DRESSINGS) ×1
BARRIER ADHS 3X4 INTERCEED (GAUZE/BANDAGES/DRESSINGS) ×2 IMPLANT
BENZOIN TINCTURE PRP APPL 2/3 (GAUZE/BANDAGES/DRESSINGS) ×3 IMPLANT
BRR ADH 4X3 ABS CNTRL BYND (GAUZE/BANDAGES/DRESSINGS) ×1
CHLORAPREP W/TINT 26ML (MISCELLANEOUS) ×3 IMPLANT
CLAMP CORD UMBIL (MISCELLANEOUS) IMPLANT
CLOSURE WOUND 1/2 X4 (GAUZE/BANDAGES/DRESSINGS) ×1
CLOTH BEACON ORANGE TIMEOUT ST (SAFETY) ×3 IMPLANT
DRSG OPSITE POSTOP 4X10 (GAUZE/BANDAGES/DRESSINGS) ×3 IMPLANT
ELECT REM PT RETURN 9FT ADLT (ELECTROSURGICAL) ×3
ELECTRODE REM PT RTRN 9FT ADLT (ELECTROSURGICAL) ×1 IMPLANT
EXTRACTOR VACUUM KIWI (MISCELLANEOUS) IMPLANT
GAUZE SPONGE 4X4 12PLY STRL LF (GAUZE/BANDAGES/DRESSINGS) ×2 IMPLANT
GLOVE BIOGEL M 7.0 STRL (GLOVE) ×6 IMPLANT
GLOVE BIOGEL PI IND STRL 7.0 (GLOVE) ×3 IMPLANT
GLOVE BIOGEL PI INDICATOR 7.0 (GLOVE) ×6
GOWN STRL REUS W/TWL LRG LVL3 (GOWN DISPOSABLE) ×9 IMPLANT
KIT ABG SYR 3ML LUER SLIP (SYRINGE) IMPLANT
NDL HYPO 25X5/8 SAFETYGLIDE (NEEDLE) IMPLANT
NEEDLE HYPO 25X5/8 SAFETYGLIDE (NEEDLE) IMPLANT
NS IRRIG 1000ML POUR BTL (IV SOLUTION) ×3 IMPLANT
PACK C SECTION WH (CUSTOM PROCEDURE TRAY) ×3 IMPLANT
PAD ABD 7.5X8 STRL (GAUZE/BANDAGES/DRESSINGS) ×2 IMPLANT
PAD OB MATERNITY 4.3X12.25 (PERSONAL CARE ITEMS) ×3 IMPLANT
PENCIL SMOKE EVAC W/HOLSTER (ELECTROSURGICAL) ×3 IMPLANT
RTRCTR C-SECT PINK 25CM LRG (MISCELLANEOUS) IMPLANT
SET BERKELEY SUCTION TUBING (SUCTIONS) ×2 IMPLANT
SPONGE GAUZE 4X4 12PLY STER LF (GAUZE/BANDAGES/DRESSINGS) ×4 IMPLANT
SPONGE LAP 18X18 RF (DISPOSABLE) ×2 IMPLANT
STRIP CLOSURE SKIN 1/2X4 (GAUZE/BANDAGES/DRESSINGS) ×2 IMPLANT
SUT PDS AB 0 CT1 27 (SUTURE) ×6 IMPLANT
SUT PLAIN 0 NONE (SUTURE) IMPLANT
SUT VIC AB 0 CTX 36 (SUTURE) ×12
SUT VIC AB 0 CTX36XBRD ANBCTRL (SUTURE) ×3 IMPLANT
SUT VIC AB 2-0 CT1 27 (SUTURE) ×6
SUT VIC AB 2-0 CT1 TAPERPNT 27 (SUTURE) ×1 IMPLANT
SUT VIC AB 3-0 SH 27 (SUTURE)
SUT VIC AB 3-0 SH 27X BRD (SUTURE) IMPLANT
SUT VIC AB 4-0 KS 27 (SUTURE) ×3 IMPLANT
TOWEL OR 17X24 6PK STRL BLUE (TOWEL DISPOSABLE) ×3 IMPLANT
TRAY FOLEY W/BAG SLVR 14FR LF (SET/KITS/TRAYS/PACK) ×3 IMPLANT
WATER STERILE IRR 1000ML POUR (IV SOLUTION) ×3 IMPLANT
YANKAUER SUCT BULB TIP NO VENT (SUCTIONS) ×2 IMPLANT

## 2020-09-06 NOTE — Op Note (Signed)
Cesarean Section Procedure Note  Indications: placenta previa  Pre-operative Diagnosis: 37 week 0 day pregnancy.  Post-operative Diagnosis: same  Surgeon: Gerald Leitz M.D.  Assistants: Steva Ready D.O.   Anesthesia: Spinal anesthesia  ASA Class: 2   Procedure Details   The patient was seen in the Holding Room. The risks, benefits, complications, treatment options, and expected outcomes were discussed with the patient.  The patient concurred with the proposed plan, giving informed consent.  The site of surgery properly noted/marked. The patient was taken to Operating Room # A, identified as Geovana Gene and the procedure verified as C-Section Delivery. A Time Out was held and the above information confirmed.  After induction of anesthesia, the patient was draped and prepped in the usual sterile manner. A Pfannenstiel incision was made and carried down through the subcutaneous tissue to the fascia. Fascial incision was made and extended transversely. The fascia was separated from the underlying rectus tissue superiorly and inferiorly. The peritoneum was identified and entered. Peritoneal incision was extended longitudinally. The utero-vesical peritoneal reflection was incised transversely and the bladder flap was bluntly freed from the lower uterine segment. A low transverse uterine incision was made. Delivered from transverse presentation was a  Female with Apgar scores of 9 at one minute and 10 at five minutes. After the umbilical cord was clamped and cut cord blood was obtained for evaluation. The placenta was removed intact and appeared normal. The uterine outline, tubes and ovaries appeared normal. The uterine incision was closed with running locked sutures of 0 vicryl. A second layer of 0 vicryl was used to imbricate the incision . Hemostasis was observed. Lavage was carried out until clear.  Interseed was placed along the uterine incision. The peritonieum was reapproximated with 2-0  vicryl. The fascia was then reapproximated with running sutures of Staples and 0 pds. The skin was reapproximated with 4-0 vicryl .  Instrument, sponge, and needle counts were correct prior the abdominal closure and at the conclusion of the case.   Findings:  Anterior placenta. Transverse presentation. Clear amniotic fluid.   Estimated Blood Loss:  approximately 700 cc.. 150 cc of RBC reinfused from cell saver          Drains: None         Total IV Fluids:  Per anesthesia ml         Specimens: Placenta and Disposition:  Sent to Pathology          Implants: None         Complications:  None; patient tolerated the procedure well.         Disposition: PACU - hemodynamically stable.         Condition: stable  Attending Attestation: I performed the procedure.

## 2020-09-06 NOTE — Anesthesia Preprocedure Evaluation (Addendum)
Anesthesia Evaluation  Patient identified by MRN, date of birth, ID band Patient awake    Airway Mallampati: II  TM Distance: >3 FB Neck ROM: Full    Dental  (+) Teeth Intact   Pulmonary neg pulmonary ROS,    Pulmonary exam normal        Cardiovascular negative cardio ROS   Rhythm:Regular Rate:Normal     Neuro/Psych negative neurological ROS  negative psych ROS   GI/Hepatic negative GI ROS, Neg liver ROS,   Endo/Other  negative endocrine ROS  Renal/GU negative Renal ROS     Musculoskeletal negative musculoskeletal ROS (+)   Abdominal (+)  Abdomen: soft. Bowel sounds: normal.  Peds  Hematology negative hematology ROS (+)   Anesthesia Other Findings   Reproductive/Obstetrics (+) Pregnancy Breech position, placenta previa                             Anesthesia Physical Anesthesia Plan  ASA: III  Anesthesia Plan: Spinal   Post-op Pain Management:    Induction:   PONV Risk Score and Plan: 2 and Ondansetron, Dexamethasone and Treatment may vary due to age or medical condition  Airway Management Planned: Simple Face Mask, Natural Airway and Nasal Cannula  Additional Equipment: None  Intra-op Plan:   Post-operative Plan:   Informed Consent: I have reviewed the patients History and Physical, chart, labs and discussed the procedure including the risks, benefits and alternatives for the proposed anesthesia with the patient or authorized representative who has indicated his/her understanding and acceptance.     Dental advisory given  Plan Discussed with: CRNA  Anesthesia Plan Comments: (Lab Results      Component                Value               Date                      WBC                      9.3                 09/03/2020                HGB                      12.0                09/03/2020                HCT                      34.8 (L)            09/03/2020                 MCV                      94.1                09/03/2020                PLT                      278  09/03/2020          )        Anesthesia Quick Evaluation

## 2020-09-06 NOTE — Anesthesia Procedure Notes (Signed)
Spinal  Patient location during procedure: OR Start time: 09/06/2020 12:42 PM End time: 09/06/2020 12:45 PM Staffing Performed: anesthesiologist  Anesthesiologist: Atilano Median, DO Preanesthetic Checklist Completed: patient identified, IV checked, site marked, risks and benefits discussed, surgical consent, monitors and equipment checked, pre-op evaluation and timeout performed Spinal Block Patient position: sitting Prep: DuraPrep Patient monitoring: heart rate, cardiac monitor, continuous pulse ox and blood pressure Approach: midline Location: L3-4 Injection technique: single-shot Needle Needle type: Pencan  Needle gauge: 24 G Needle length: 10 cm Additional Notes Patient identified. Risks/Benefits/Options discussed with patient including but not limited to bleeding, infection, nerve damage, paralysis, failed block, incomplete pain control, headache, blood pressure changes, nausea, vomiting, reactions to medications, itching and postpartum back pain. Confirmed with bedside nurse the patient's most recent platelet count. Confirmed with patient that they are not currently taking any anticoagulation, have any bleeding history or any family history of bleeding disorders. Patient expressed understanding and wished to proceed. All questions were answered. Sterile technique was used throughout the entire procedure. Please see nursing notes for vital signs. Warning signs of high block given to the patient including shortness of breath, tingling/numbness in hands, complete motor block, or any concerning symptoms with instructions to call for help. Patient was given instructions on fall risk and not to get out of bed. All questions and concerns addressed with instructions to call with any issues or inadequate analgesia.

## 2020-09-06 NOTE — Anesthesia Postprocedure Evaluation (Signed)
Anesthesia Post Note  Patient: Vanessa Allison  Procedure(s) Performed: CESAREAN SECTION (N/A ) APPLICATION OF CELL SAVER (N/A )     Patient location during evaluation: Mother Baby Anesthesia Type: Spinal Level of consciousness: oriented and awake and alert Pain management: pain level controlled Vital Signs Assessment: post-procedure vital signs reviewed and stable Respiratory status: spontaneous breathing and respiratory function stable Cardiovascular status: blood pressure returned to baseline and stable Postop Assessment: no headache, no backache, no apparent nausea or vomiting and able to ambulate Anesthetic complications: no   No complications documented.  Last Vitals:  Vitals:   09/06/20 1530 09/06/20 1541  BP: 129/69 114/71  Pulse: 81 63  Resp: 16 16  Temp: 36.4 C 36.6 C  SpO2: 96% 97%    Last Pain:  Vitals:   09/06/20 1541  TempSrc: Oral  PainSc: 0-No pain   Pain Goal:                   [name]

## 2020-09-06 NOTE — Progress Notes (Signed)
Patient has had 3 emesis episodes since being admitted to [facility] with complaints of nausea.  Patient has put out a total of 75 ml of urine in 2 hours.  Gerald Leitz MD called and notified of patients N/V and urine output. Telephone orders given and patient will continued to be monitored.

## 2020-09-06 NOTE — H&P (Signed)
Date of Initial H&P: 09/05/2020  History reviewed, patient examined, no change in status, stable for surgery. 

## 2020-09-06 NOTE — Transfer of Care (Signed)
Immediate Anesthesia Transfer of Care Note  Patient: Vanessa Allison  Procedure(s) Performed: CESAREAN SECTION (N/A ) APPLICATION OF CELL SAVER (N/A )  Patient Location: PACU  Anesthesia Type:Spinal  Level of Consciousness: awake  Airway & Oxygen Therapy: Patient Spontanous Breathing  Post-op Assessment: Report given to RN and Post -op Vital signs reviewed and stable  Post vital signs: Reviewed and stable  Last Vitals:  Vitals Value Taken Time  BP 112/70 09/06/20 1415  Temp    Pulse 87 09/06/20 1416  Resp 17 09/06/20 1416  SpO2 100 % 09/06/20 1416  Vitals shown include unvalidated device data.  Last Pain:  Vitals:   09/06/20 1038  TempSrc: Oral         Complications: No complications documented.

## 2020-09-07 LAB — CBC
HCT: 28.7 % — ABNORMAL LOW (ref 36.0–46.0)
Hemoglobin: 9.9 g/dL — ABNORMAL LOW (ref 12.0–15.0)
MCH: 32.6 pg (ref 26.0–34.0)
MCHC: 34.5 g/dL (ref 30.0–36.0)
MCV: 94.4 fL (ref 80.0–100.0)
Platelets: 236 10*3/uL (ref 150–400)
RBC: 3.04 MIL/uL — ABNORMAL LOW (ref 3.87–5.11)
RDW: 13.3 % (ref 11.5–15.5)
WBC: 16.3 10*3/uL — ABNORMAL HIGH (ref 4.0–10.5)
nRBC: 0 % (ref 0.0–0.2)

## 2020-09-07 LAB — BIRTH TISSUE RECOVERY COLLECTION (PLACENTA DONATION)

## 2020-09-07 MED ORDER — PROMETHAZINE HCL 25 MG/ML IJ SOLN: 12.5000 mg | Freq: Four times a day (QID) | INTRAMUSCULAR | Status: DC | PRN

## 2020-09-07 NOTE — Progress Notes (Signed)
Dr Richardson Dopp is notified by phone.that Vanessa Allison was showering and felt dizzy as she was removing her pressure dressingat approximately 1225. Her husband eased her to the shower floor. Ammonia salts were used per RN Cloe Wincoff. Pt was transferred yo bed via wheelchair. Her husband stayed Vanessa Allison did not hit her head or any body parts on the wall or floor.  Pt denies any pain or injury. VS BP101/58, HR62, RR 18, 100% O2.. FF@U  small lochia. LTCS honeycomb dressing has 2 scant drop staining-old drainage-marked.  Vanessa Allison had 650mg  Tylenol prior to her shower. No body injury is noted. No new orders received.

## 2020-09-07 NOTE — Progress Notes (Signed)
Husband pulled emergency shower cord for patient around 1225. RN (Bular Hickok) and NT Joanne Gavel) found mother sitting on floor in shower. Patient said she started feeling faint and called for husband who caught her and assisted her to the floor. Patient states she did not hit her head or any other body part but with help from her husband slowly fell to a seated position. She did not hurt her incision either. She said she is not in any pain but still dizzy with tunnel vision and hard of hearing. Ammonia activated to help mother feel more alert in order to get into wheelchair. Mother assisted off ground by RNs (Betania Dizon, J lewis and D wear), as well as her husband. Mother helped back to bed as her RN (B. mckinnon) was able to get report on event and assess mother. Her RN (mckinnon) to update provider on status.

## 2020-09-07 NOTE — Progress Notes (Signed)
Reviewed chart and concur with student's documentation.  

## 2020-09-07 NOTE — Progress Notes (Signed)
Subjective: Postpartum Day 1: Cesarean Delivery Patient reports incisional pain and tolerating PO.  Nausea has resolved. Pain with ambulation   Objective: Vital signs in last 24 hours: Temp:  [96 F (35.6 C)-98.2 F (36.8 C)] 98.1 F (36.7 C) (12/07 0800) Pulse Rate:  [60-90] 65 (12/07 0800) Resp:  [11-22] 18 (12/07 0800) BP: (98-129)/(59-87) 107/59 (12/07 0800) SpO2:  [96 %-100 %] 99 % (12/07 0510)  Physical Exam:  General: alert, cooperative and no distress Lochia: appropriate Uterine Fundus: firm Incision: bandage clean dry and intact DVT Evaluation: No evidence of DVT seen on physical exam.  Recent Labs    09/07/20 0555  HGB 9.9*  HCT 28.7*    Assessment/Plan: Status post Cesarean section. Doing well postoperatively.  Encouraged ambulation.  Patient may shower after noon/ she is advised to remove pressure dressing in the shower.  R/B/A of circumcision reviewed. Pt voiced understaning plan for circ of newborn tomorrow  .  Gerald Leitz 09/07/2020, 12:50 PM

## 2020-09-07 NOTE — Progress Notes (Signed)
Pt has ambulated 3 times this afternoon without dizziness.

## 2020-09-08 MED ORDER — OXYCODONE HCL 5 MG PO TABS
5.0000 mg | ORAL_TABLET | ORAL | 0 refills | Status: DC | PRN
Start: 2020-09-08 — End: 2021-09-14

## 2020-09-08 MED ORDER — IBUPROFEN 800 MG PO TABS
800.0000 mg | ORAL_TABLET | Freq: Three times a day (TID) | ORAL | 0 refills | Status: DC | PRN
Start: 2020-09-08 — End: 2021-09-14

## 2020-09-08 MED FILL — Heparin Sodium (Porcine) Inj 1000 Unit/ML: INTRAMUSCULAR | Qty: 30 | Status: AC

## 2020-09-08 MED FILL — Sodium Chloride Irrigation Soln 0.9%: Qty: 3000 | Status: AC

## 2020-09-08 MED FILL — Sodium Chloride IV Soln 0.9%: INTRAVENOUS | Qty: 1000 | Status: AC

## 2020-09-08 NOTE — Discharge Summary (Signed)
Postpartum Discharge Summary  Date of Service updated 09/08/2020     Patient Name: Vanessa Allison DOB: Feb 24, 1991 MRN: [id number]  Date of admission: 09/06/2020 Delivery date:09/06/2020  Delivering provider: Christophe Louis  Date of discharge: 09/08/2020  Admitting diagnosis: Placenta previa [O44.00] Intrauterine pregnancy: [redacted]w[redacted]d    Secondary diagnosis:  Active Problems:   Placenta previa  Additional problems: None    Discharge diagnosis: Term Pregnancy Delivered                                              Post partum procedures:None Augmentation: N/A Complications: None  Hospital course: Sceduled C/S   29y.o. yo [id number] admitted to the hospital 09/06/2020 for scheduled cesarean section with the following indication:Previa.Delivery details are as follows:  Membrane Rupture Time/Date: 12:09 PM ,09/06/2020   Delivery Method:C-Section, Low Transverse  Details of operation can be found in separate operative note.  Patient had an uncomplicated postpartum course.  She is ambulating, tolerating a regular diet, passing flatus, and urinating well. Patient is discharged home in stable condition on  09/08/20        Newborn Data: Birth date:09/06/2020  Birth time:1:10 PM  Gender:Female  Living status:Living  Apgars:9 ,10  Weight:3570 g     Magnesium Sulfate received: No BMZ received: No Rhophylac:N/A MMR:N/A T-DaP:Given prenatally Flu: N/A Transfusion:Yes  Auto transfusion using cell saver  Physical exam  Vitals:   09/07/20 1650 09/07/20 2113 09/08/20 0615 09/08/20 1430  BP: 105/64 102/60 109/64 (!) 102/59  Pulse: 64 70 70 94  Resp: _0 Temp: 98.5 F (36.9 C) 98.4 F (36.9 C) 98.4 F (36.9 C) 98.2 F (36.8 C)  TempSrc: Oral Oral Oral Oral  SpO2: 99% 99% 98%   Weight:      Height:       General: alert, cooperative and no distress Lochia: appropriate Uterine Fundus: firm Incision: Dressing is clean, dry, and intact DVT Evaluation: No evidence of DVT  seen on physical exam. Labs: Lab Results  Component Value Date   WBC 16.3 (H) 09/07/2020   HGB 9.9 (L) 09/07/2020   HCT 28.7 (L) 09/07/2020   MCV 94.4 09/07/2020   PLT 236 09/07/2020   No flowsheet data found. Edinburgh Score: Edinburgh Postnatal Depression Scale Screening Tool 09/07/2020  I have been able to laugh and see the funny side of things. 0  I have looked forward with enjoyment to things. 0  I have blamed myself unnecessarily when things went wrong. 1  I have been anxious or worried for no good reason. 2  I have felt scared or panicky for no good reason. 1  Things have been getting on top of me. 1  I have been so unhappy that I have had difficulty sleeping. 0  I have felt sad or miserable. 0  I have been so unhappy that I have been crying. 0  The thought of harming myself has occurred to me. 0  Edinburgh Postnatal Depression Scale Total 5      After visit meds:  Allergies as of 09/08/2020   No Known Allergies     Medication List    TAKE these medications   acetaminophen 325 MG tablet Commonly known as: TYLENOL Take 650 mg by mouth every 6 (six) hours as needed for moderate pain.   calcium carbonate 500 MG  chewable tablet Commonly known as: TUMS - dosed in mg elemental calcium Chew 500 mg by mouth daily as needed for indigestion or heartburn.   ibuprofen 800 MG tablet Commonly known as: ADVIL Take 1 tablet (800 mg total) by mouth every 8 (eight) hours as needed.   multivitamin-prenatal 27-0.8 MG Tabs tablet Take 1 tablet by mouth daily at 12 noon.   oxyCODONE 5 MG immediate release tablet Commonly known as: Oxy IR/ROXICODONE Take 1-2 tablets (5-10 mg total) by mouth every 4 (four) hours as needed for moderate pain.        Discharge home in stable condition Infant Feeding: Breast Infant Disposition:home with mother Discharge instruction: per After Visit Summary and Postpartum booklet. Activity: Advance as tolerated. Pelvic rest for 6 weeks.  Diet:  routine diet Anticipated Birth Control: Unsure Postpartum Appointment:2 weeks Additional Postpartum F/U: NA Future Appointments:No future appointments. Follow up Visit:  Follow-up Information    Christophe Louis, MD. Schedule an appointment as soon as possible for a visit in 2 week(s).   Specialty: Obstetrics and Gynecology Why: please schedule an appointment for incision check in 2 weeks  Contact information: [address] [place] [address] [phone]                   09/08/2020 Christophe Louis, MD

## 2021-09-14 ENCOUNTER — Other Ambulatory Visit: Payer: Self-pay

## 2021-09-14 ENCOUNTER — Ambulatory Visit: Payer: 59 | Admitting: Nurse Practitioner

## 2021-09-14 ENCOUNTER — Encounter: Payer: Self-pay | Admitting: Nurse Practitioner

## 2021-09-14 VITALS — BP 105/68 | HR 79 | Temp 98.1°F | Ht 66.0 in | Wt 181.7 lb

## 2021-09-14 DIAGNOSIS — R42 Dizziness and giddiness: Secondary | ICD-10-CM | POA: Diagnosis not present

## 2021-09-14 DIAGNOSIS — E559 Vitamin D deficiency, unspecified: Secondary | ICD-10-CM | POA: Diagnosis not present

## 2021-09-14 DIAGNOSIS — Z7689 Persons encountering health services in other specified circumstances: Secondary | ICD-10-CM

## 2021-09-14 DIAGNOSIS — Z6829 Body mass index (BMI) 29.0-29.9, adult: Secondary | ICD-10-CM

## 2021-09-14 DIAGNOSIS — Z0000 Encounter for general adult medical examination without abnormal findings: Secondary | ICD-10-CM

## 2021-09-14 DIAGNOSIS — D509 Iron deficiency anemia, unspecified: Secondary | ICD-10-CM | POA: Diagnosis not present

## 2021-09-14 DIAGNOSIS — R5383 Other fatigue: Secondary | ICD-10-CM | POA: Diagnosis not present

## 2021-09-14 NOTE — Patient Instructions (Signed)
Fat and Cholesterol Restricted Eating Plan Getting too much fat and cholesterol in your diet may cause health problems. Choosing the right foods helps keep your fat and cholesterol at normal levels. This can keep you from getting certain diseases. Your doctor may recommend an eating plan that includes: Total fat: ______% or less of total calories a day. This is ______g of fat a day. Saturated fat: ______% or less of total calories a day. This is ______g of saturated fat a day. Cholesterol: less than _________mg a day. Fiber: ______g a day. What are tips for following this plan? General tips Work with your doctor to lose weight if you need to. Avoid: Foods with added sugar. Fried foods. Foods with trans fat or partially hydrogenated oils. This includes some margarines and baked goods. If you drink alcohol: Limit how much you have to: 0-1 drink a day for women who are not pregnant. 0-2 drinks a day for men. Know how much alcohol is in a drink. In the [place] one drink equals one 12 oz bottle of beer (355 mL), one 5 oz glass of wine (148 mL), or one 1 oz glass of hard liquor (44 mL). Reading food labels Check food labels for: Trans fats. Partially hydrogenated oils. Saturated fat (g) in each serving. Cholesterol (mg) in each serving. Fiber (g) in each serving. Choose foods with healthy fats, such as: Monounsaturated fats and polyunsaturated fats. These include olive and canola oil, flaxseeds, walnuts, almonds, and seeds. Omega-3 fats. These are found in certain fish, flaxseed oil, and ground flaxseeds. Choose grain products that have whole grains. Look for the word "whole" as the first word in the ingredient list. Cooking Cook foods using low-fat methods. These include baking, boiling, grilling, and broiling. Eat more home-cooked foods. Eat at restaurants and buffets less often. Eat less fast food. Avoid cooking using saturated fats, such as butter, cream, palm oil, palm kernel oil, and  coconut oil. Meal planning  At meals, divide your plate into four equal parts: Fill one-half of your plate with vegetables, green salads, and fruit. Fill one-fourth of your plate with whole grains. Fill one-fourth of your plate with low-fat (lean) protein foods. Eat fish that is high in omega-3 fats at least two times a week. This includes mackerel, tuna, sardines, and salmon. Eat foods that are high in fiber, such as whole grains, beans, apples, pears, berries, broccoli, carrots, peas, and barley. What foods should I eat? Fruits All fresh, canned (in natural juice), or frozen fruits. Vegetables Fresh or frozen vegetables (raw, steamed, roasted, or grilled). Green salads. Grains Whole grains, such as whole wheat or whole grain breads, crackers, cereals, and pasta. Unsweetened oatmeal, bulgur, barley, quinoa, or brown rice. Corn or whole wheat flour tortillas. Meats and other protein foods Ground beef (85% or leaner), grass-fed beef, or beef trimmed of fat. Skinless chicken or turkey. Ground chicken or turkey. Pork trimmed of fat. All fish and seafood. Egg whites. Dried beans, peas, or lentils. Unsalted nuts or seeds. Unsalted canned beans. Nut butters without added sugar or oil. Dairy Low-fat or nonfat dairy products, such as skim or 1% milk, 2% or reduced-fat cheeses, low-fat and fat-free ricotta or cottage cheese, or plain low-fat and nonfat yogurt. Fats and oils Tub margarine without trans fats. Light or reduced-fat mayonnaise and salad dressings. Avocado. Olive, canola, sesame, or safflower oils. The items listed above may not be a complete list of foods and beverages you can eat. Contact a dietitian for more information. What foods   should I avoid? Fruits Canned fruit in heavy syrup. Fruit in cream or butter sauce. Fried fruit. Vegetables Vegetables cooked in cheese, cream, or butter sauce. Fried vegetables. Grains White bread. White pasta. White rice. Cornbread. Bagels, pastries,  and croissants. Crackers and snack foods that contain trans fat and hydrogenated oils. Meats and other protein foods Fatty cuts of meat. Ribs, chicken wings, bacon, sausage, bologna, salami, chitterlings, fatback, hot dogs, bratwurst, and packaged lunch meats. Liver and organ meats. Whole eggs and egg yolks. Chicken and turkey with skin. Fried meat. Dairy Whole or 2% milk, cream, half-and-half, and cream cheese. Whole milk cheeses. Whole-fat or sweetened yogurt. Full-fat cheeses. Nondairy creamers and whipped toppings. Processed cheese, cheese spreads, and cheese curds. Fats and oils Butter, stick margarine, lard, shortening, ghee, or bacon fat. Coconut, palm kernel, and palm oils. Beverages Alcohol. Sugar-sweetened drinks such as sodas, lemonade, and fruit drinks. Sweets and desserts Corn syrup, sugars, honey, and molasses. Candy. Jam and jelly. Syrup. Sweetened cereals. Cookies, pies, cakes, donuts, muffins, and ice cream. The items listed above may not be a complete list of foods and beverages you should avoid. Contact a dietitian for more information. Summary Choosing the right foods helps keep your fat and cholesterol at normal levels. This can keep you from getting certain diseases. At meals, fill one-half of your plate with vegetables, green salads, and fruits. Eat high fiber foods, like whole grains, beans, apples, pears, berries, carrots, peas, and barley. Limit added sugar, saturated fats, alcohol, and fried foods. This information is not intended to replace advice given to you by your health care provider. Make sure you discuss any questions you have with your health care provider. Document Revised: 01/28/2021 Document Reviewed: 01/28/2021 Elsevier Patient Education  2022 Elsevier Inc.  

## 2021-09-14 NOTE — Progress Notes (Signed)
New Patient Office Visit  Subjective:  Patient ID: Vanessa Allison, female    DOB: 18-Nov-1990  Age: 30 y.o. MRN: [id number]  CC:  Chief Complaint  Patient presents with   New Patient (Initial Visit)     HPI Vanessa Allison presents to establish new primary care provider. She states she had been breast feeding for about a year. Weaned from breast feeding at the end October. She had first period toward the end of November. Birth control was switched at that time. She states that during the menstrual cycle, she was having heavy menstrual bleeding. She took warm shower and then experienced episode of near-syncope. She felt very dizzy. She denies palpitations or chest pain. States that she had not had anything to drink. She denies nausea or vomiting. She states that the episode lasted for about 15 minutes then resolved on it's own. She has not experienced this again. She is due to have another menstrual cycle this coming Saturday.  She has n other concerns or complaints today.   Past Medical History:  Diagnosis Date   Medical history non-contributory    Placenta previa     Past Surgical History:  Procedure Laterality Date   CESAREAN SECTION N/A 09/06/2020   Procedure: CESAREAN SECTION;  Surgeon: Gerald Leitz, MD;  Location: [facility];  Service: Obstetrics;  Laterality: N/A;   NO PAST SURGERIES     WISDOM TOOTH EXTRACTION  2009    Family History  Problem Relation Age of Onset   Heart disease Maternal Grandfather    Cancer Paternal Grandmother        BREAST   Heart disease Paternal Grandfather    Stroke Paternal Grandfather    Hypertension Mother    Other Mother        polycystic fibrosis   Diabetes Maternal Grandmother     Social History   Socioeconomic History   Marital status: Married    Spouse name: Not on file   Number of children: Not on file   Years of education: Not on file   Highest education level: Not on file  Occupational History   Not on file  Tobacco Use    Smoking status: Never   Smokeless tobacco: Never  Substance and Sexual Activity   Alcohol use: No   Drug use: No   Sexual activity: Yes  Other Topics Concern   Not on file  Social History Narrative   Not on file   Social Determinants of Health   Financial Resource Strain: Not on file  Food Insecurity: Not on file  Transportation Needs: Not on file  Physical Activity: Not on file  Stress: Not on file  Social Connections: Not on file  Intimate Partner Violence: Not on file    ROS Review of Systems  Constitutional:  Positive for fever. Negative for activity change, appetite change, chills and fatigue.  HENT:  Negative for congestion, postnasal drip, rhinorrhea, sinus pressure, sinus pain, sneezing and sore throat.   Eyes: Negative.   Respiratory:  Negative for cough, chest tightness, shortness of breath and wheezing.   Cardiovascular:  Negative for chest pain and palpitations.  Gastrointestinal:  Negative for abdominal pain, constipation, diarrhea, nausea and vomiting.  Endocrine: Negative for cold intolerance, heat intolerance, polydipsia and polyuria.  Genitourinary:  Negative for dyspareunia, dysuria, flank pain, frequency and urgency.  Musculoskeletal:  Negative for arthralgias, back pain and myalgias.  Skin:  Negative for rash.  Allergic/Immunologic: Negative for environmental allergies.  Neurological:  Positive for dizziness.  Negative for weakness and headaches.  Hematological:  Negative for adenopathy.  Psychiatric/Behavioral:  The patient is not nervous/anxious.    Objective:   Today's Vitals   09/14/21 0937  BP: 105/68  Pulse: 79  Temp: 98.1 F (36.7 C)  SpO2: 99%  Weight: 181 lb 11.2 oz (82.4 kg)  Height: 5\' 6"  (1.676 m)   Body mass index is 29.33 kg/m.   Physical Exam Vitals and nursing note reviewed.  Constitutional:      Appearance: Normal appearance. She is well-developed.  HENT:     Head: Normocephalic and atraumatic.  Eyes:     Pupils: Pupils  are equal, round, and reactive to light.  Cardiovascular:     Rate and Rhythm: Normal rate and regular rhythm.     Pulses: Normal pulses.     Heart sounds: Normal heart sounds.  Pulmonary:     Effort: Pulmonary effort is normal.     Breath sounds: Normal breath sounds.  Abdominal:     Palpations: Abdomen is soft.  Musculoskeletal:        General: Normal range of motion.     Cervical back: Normal range of motion and neck supple.  Lymphadenopathy:     Cervical: No cervical adenopathy.  Skin:    General: Skin is warm and dry.     Capillary Refill: Capillary refill takes less than 2 seconds.  Neurological:     General: No focal deficit present.     Mental Status: She is alert and oriented to person, place, and time.     Cranial Nerves: No cranial nerve deficit.     Sensory: No sensory deficit.     Motor: No weakness.     Coordination: Coordination normal.     Gait: Gait normal.  Psychiatric:        Mood and Affect: Mood normal.        Behavior: Behavior normal.        Thought Content: Thought content normal.        Judgment: Judgment normal.    Assessment & Plan:  1. Encounter to establish care Appointment today to establish new primary care provider    2. Dizziness Unclear etiology.  Patient neurologically intact.  We will check CBC, ferritin, and vitamin B12 level for further evaluation. - CBC with Differential/Platelet - Ferritin - Vitamin B12  3. Other fatigue Will check blood count, thyroid panel, ferritin, B12, and CMP for further evaluation. - CBC with Differential/Platelet - Comprehensive metabolic panel - T4, free - TSH - Ferritin - Vitamin B12  4. Iron deficiency anemia, unspecified iron deficiency anemia type CBC with ferritin and B12 further evaluation. - CBC with Differential/Platelet - Ferritin - Vitamin B12  5. Vitamin D deficiency Check vitamin D level and treat deficiency as indicated. - Vitamin D 1,25 dihydroxy - Vitamin D (25 hydroxy) -  Vitamin D (25 hydroxy)  6. Body mass index 29.0-29.9, adult Discussed lowering calorie intake to 1500 calories per day and incorporating exercise into daily routine to help lose weight.   7. Healthcare maintenance Check fasting lipid panel along with other labs during today's visit. - CBC with Differential/Platelet - Comprehensive metabolic panel - Lipid panel   Problem List Items Addressed This Visit       Other   Dizziness   Relevant Orders   CBC with Differential/Platelet   Ferritin   Vitamin B12   Other fatigue   Relevant Orders   CBC with Differential/Platelet   Comprehensive metabolic panel  T4, free   TSH   Ferritin   Vitamin B12   Iron deficiency anemia   Relevant Orders   CBC with Differential/Platelet   Ferritin   Vitamin B12   Vitamin D deficiency   Relevant Orders   Vitamin D 1,25 dihydroxy   Vitamin D (25 hydroxy)   Vitamin D (25 hydroxy)   Body mass index 29.0-29.9, adult   Other Visit Diagnoses     Encounter to establish care    -  Primary   Healthcare maintenance       Relevant Orders   CBC with Differential/Platelet   Comprehensive metabolic panel   Lipid panel       Outpatient Encounter Medications as of 09/14/2021  Medication Sig   norethindrone (MICRONOR) 0.35 MG tablet Take 1 tablet by mouth daily.   norethindrone (MICRONOR) 0.35 MG tablet    acetaminophen (TYLENOL) 325 MG tablet Take 650 mg by mouth every 6 (six) hours as needed for moderate pain.    calcium carbonate (TUMS - DOSED IN MG ELEMENTAL CALCIUM) 500 MG chewable tablet Chew 500 mg by mouth daily as needed for indigestion or heartburn.    ibuprofen (ADVIL) 800 MG tablet Take 1 tablet (800 mg total) by mouth every 8 (eight) hours as needed.   oxyCODONE (OXY IR/ROXICODONE) 5 MG immediate release tablet Take 1-2 tablets (5-10 mg total) by mouth every 4 (four) hours as needed for moderate pain.   Prenatal Vit-Fe Fumarate-FA (MULTIVITAMIN-PRENATAL) 27-0.8 MG TABS tablet Take 1  tablet by mouth daily at 12 noon.   No facility-administered encounter medications on file as of 09/14/2021.    Follow-up: Return in about 4 weeks (around 10/12/2021) for health maintenance exam.   Carlean Jews, NP

## 2021-09-15 ENCOUNTER — Encounter: Payer: Self-pay | Admitting: Nurse Practitioner

## 2021-09-15 LAB — COMPREHENSIVE METABOLIC PANEL
ALT: 11 IU/L (ref 0–32)
AST: 15 IU/L (ref 0–40)
Albumin/Globulin Ratio: 1.7 (ref 1.2–2.2)
Albumin: 4.3 g/dL (ref 3.9–5.0)
Alkaline Phosphatase: 35 IU/L — ABNORMAL LOW (ref 44–121)
BUN/Creatinine Ratio: 15 (ref 9–23)
BUN: 12 mg/dL (ref 6–20)
Bilirubin Total: 0.5 mg/dL (ref 0.0–1.2)
CO2: 22 mmol/L (ref 20–29)
Calcium: 9.2 mg/dL (ref 8.7–10.2)
Chloride: 104 mmol/L (ref 96–106)
Creatinine, Ser: 0.78 mg/dL (ref 0.57–1.00)
Globulin, Total: 2.6 g/dL (ref 1.5–4.5)
Glucose: 71 mg/dL (ref 70–99)
Potassium: 4.7 mmol/L (ref 3.5–5.2)
Sodium: 139 mmol/L (ref 134–144)
Total Protein: 6.9 g/dL (ref 6.0–8.5)
eGFR: 105 mL/min/{1.73_m2} (ref >59)

## 2021-09-15 LAB — CBC WITH DIFFERENTIAL/PLATELET
Basophils Absolute: 0 10*3/uL (ref 0.0–0.2)
Basos: 0 %
EOS (ABSOLUTE): 0.1 10*3/uL (ref 0.0–0.4)
Eos: 1 %
Hematocrit: 39.3 % (ref 34.0–46.6)
Hemoglobin: 13.3 g/dL (ref 11.1–15.9)
Immature Grans (Abs): 0 10*3/uL (ref 0.0–0.1)
Immature Granulocytes: 0 %
Lymphocytes Absolute: 1.6 10*3/uL (ref 0.7–3.1)
Lymphs: 30 %
MCH: 31.1 pg (ref 26.6–33.0)
MCHC: 33.8 g/dL (ref 31.5–35.7)
MCV: 92 fL (ref 79–97)
Monocytes Absolute: 0.4 10*3/uL (ref 0.1–0.9)
Monocytes: 7 %
Neutrophils Absolute: 3.3 10*3/uL (ref 1.4–7.0)
Neutrophils: 62 %
Platelets: 271 10*3/uL (ref 150–450)
RBC: 4.28 x10E6/uL (ref 3.77–5.28)
RDW: 12.4 % (ref 11.7–15.4)
WBC: 5.4 10*3/uL (ref 3.4–10.8)

## 2021-09-15 LAB — LIPID PANEL
Chol/HDL Ratio: 3 ratio (ref 0.0–4.4)
Cholesterol, Total: 167 mg/dL (ref 100–199)
HDL: 56 mg/dL (ref >39)
LDL Chol Calc (NIH): 95 mg/dL (ref 0–99)
Triglycerides: 88 mg/dL (ref 0–149)
VLDL Cholesterol Cal: 16 mg/dL (ref 5–40)

## 2021-09-15 LAB — T4, FREE: Free T4: 1.16 ng/dL (ref 0.82–1.77)

## 2021-09-15 LAB — VITAMIN D 25 HYDROXY (VIT D DEFICIENCY, FRACTURES): Vit D, 25-Hydroxy: 30.7 ng/mL (ref 30.0–100.0)

## 2021-09-15 LAB — FERRITIN: Ferritin: 29 ng/mL (ref 15–150)

## 2021-09-15 LAB — VITAMIN B12: Vitamin B-12: 447 pg/mL (ref 232–1245)

## 2021-09-15 LAB — TSH: TSH: 1.22 u[IU]/mL (ref 0.450–4.500)

## 2021-09-15 NOTE — Progress Notes (Signed)
Normal labs. Mychart message sent to patient.

## 2021-10-13 ENCOUNTER — Encounter: Payer: Self-pay | Admitting: Nurse Practitioner

## 2021-10-13 ENCOUNTER — Ambulatory Visit (INDEPENDENT_AMBULATORY_CARE_PROVIDER_SITE_OTHER): Payer: 59 | Admitting: Nurse Practitioner

## 2021-10-13 ENCOUNTER — Other Ambulatory Visit: Payer: Self-pay

## 2021-10-13 VITALS — BP 103/67 | HR 77 | Temp 98.2°F | Ht 66.0 in | Wt 179.1 lb

## 2021-10-13 DIAGNOSIS — R42 Dizziness and giddiness: Secondary | ICD-10-CM

## 2021-10-13 DIAGNOSIS — Z0000 Encounter for general adult medical examination without abnormal findings: Secondary | ICD-10-CM

## 2021-10-13 NOTE — Progress Notes (Signed)
Established Patient Office Visit  Subjective:  Patient ID: Vanessa Allison, female    DOB: 11-Jun-1991  Age: 31 y.o. MRN: [id number]  CC:  Chief Complaint  Patient presents with   Annual Exam    HPI [name] presents for annual wellness visit. She denies problems or concerns today. She did have routine, fasting labs done prior to the visit. All labs ware essentially normal. She states that she recently got her well woman exam and pap smear. She goes to [facility]. We will contact them to get records to review. The patient had recently had birth control prescription changed. She states that she is doing very well on new medication.   Past Medical History:  Diagnosis Date   Medical history non-contributory    Placenta previa     Past Surgical History:  Procedure Laterality Date   CESAREAN SECTION N/A 09/06/2020   Procedure: CESAREAN SECTION;  Surgeon: Gerald Leitz, MD;  Location: [facility];  Service: Obstetrics;  Laterality: N/A;   NO PAST SURGERIES     WISDOM TOOTH EXTRACTION  2009    Family History  Problem Relation Age of Onset   Heart disease Maternal Grandfather    Cancer Paternal Grandmother        BREAST   Heart disease Paternal Grandfather    Stroke Paternal Grandfather    Hypertension Mother    Other Mother        polycystic fibrosis   Diabetes Maternal Grandmother     Social History   Socioeconomic History   Marital status: Married    Spouse name: Not on file   Number of children: Not on file   Years of education: Not on file   Highest education level: Not on file  Occupational History   Not on file  Tobacco Use   Smoking status: Never   Smokeless tobacco: Never  Substance and Sexual Activity   Alcohol use: No   Drug use: No   Sexual activity: Yes  Other Topics Concern   Not on file  Social History Narrative   Not on file   Social Determinants of Health   Financial Resource Strain: Not on file  Food Insecurity: Not on file   Transportation Needs: Not on file  Physical Activity: Not on file  Stress: Not on file  Social Connections: Not on file  Intimate Partner Violence: Not on file    Outpatient Medications Prior to Visit  Medication Sig Dispense Refill   norethindrone (CAMILA) 0.35 MG tablet 1 tablet     norethindrone-ethinyl estradiol (LOESTRIN 1/20, 21,) 1-20 MG-MCG tablet 1 tablet     norethindrone (MICRONOR) 0.35 MG tablet Take 1 tablet by mouth daily.     norethindrone (MICRONOR) 0.35 MG tablet      No facility-administered medications prior to visit.    No Known Allergies  ROS Review of Systems  Constitutional:  Negative for activity change, appetite change, chills, fatigue and fever.  HENT:  Negative for congestion, drooling, postnasal drip, rhinorrhea, sinus pressure, sinus pain, sneezing and sore throat.   Eyes: Negative.   Respiratory:  Negative for cough, chest tightness, shortness of breath and wheezing.   Cardiovascular:  Negative for chest pain and palpitations.  Gastrointestinal:  Negative for abdominal pain, constipation, diarrhea, nausea and vomiting.  Endocrine: Negative for cold intolerance, heat intolerance, polydipsia and polyuria.  Genitourinary:  Negative for dyspareunia, dysuria, flank pain, frequency and urgency.  Musculoskeletal:  Negative for arthralgias, back pain and myalgias.  Skin:  Negative for rash.  Allergic/Immunologic: Negative for environmental allergies.  Neurological:  Negative for dizziness, weakness and headaches.  Hematological:  Negative for adenopathy.  Psychiatric/Behavioral:  The patient is not nervous/anxious.      Objective:    Physical Exam Vitals and nursing note reviewed.  Constitutional:      Appearance: Normal appearance. She is well-developed.  HENT:     Head: Normocephalic and atraumatic.     Right Ear: Tympanic membrane, ear canal and external ear normal.     Left Ear: Tympanic membrane, ear canal and external ear normal.     Nose:  Nose normal.     Mouth/Throat:     Mouth: Mucous membranes are moist.     Pharynx: Oropharynx is clear. No oropharyngeal exudate or posterior oropharyngeal erythema.  Eyes:     Extraocular Movements: Extraocular movements intact.     Conjunctiva/sclera: Conjunctivae normal.     Pupils: Pupils are equal, round, and reactive to light.  Cardiovascular:     Rate and Rhythm: Normal rate and regular rhythm.     Pulses: Normal pulses.     Heart sounds: Normal heart sounds.  Pulmonary:     Effort: Pulmonary effort is normal.     Breath sounds: Normal breath sounds.  Abdominal:     General: Bowel sounds are normal. There is no distension.     Palpations: Abdomen is soft. There is no mass.     Tenderness: There is no abdominal tenderness. There is no guarding or rebound.     Hernia: No hernia is present.  Musculoskeletal:        General: Normal range of motion.     Cervical back: Normal range of motion and neck supple.  Skin:    General: Skin is warm and dry.     Capillary Refill: Capillary refill takes less than 2 seconds.  Neurological:     General: No focal deficit present.     Mental Status: She is alert and oriented to person, place, and time.  Psychiatric:        Mood and Affect: Mood normal.        Behavior: Behavior normal.        Thought Content: Thought content normal.        Judgment: Judgment normal.    Today's Vitals   10/13/21 0831  BP: 103/67  Pulse: 77  Temp: 98.2 F (36.8 C)  SpO2: 100%  Weight: 179 lb 1.9 oz (81.2 kg)  Height: 5\' 6"  (1.676 m)   Body mass index is 28.91 kg/m.   Wt Readings from Last 3 Encounters:  10/13/21 179 lb 1.9 oz (81.2 kg)  09/14/21 181 lb 11.2 oz (82.4 kg)  09/06/20 195 lb (88.5 kg)     Health Maintenance Due  Topic Date Due   Hepatitis C Screening  Never done   PAP SMEAR-Modifier  10/23/2020   COVID-19 Vaccine (4 - Booster for Pfizer series) 12/07/2020    There are no preventive care reminders to display for this  patient.  Lab Results  Component Value Date   TSH 1.220 09/14/2021   Lab Results  Component Value Date   WBC 5.4 09/14/2021   HGB 13.3 09/14/2021   HCT 39.3 09/14/2021   MCV 92 09/14/2021   PLT 271 09/14/2021   Lab Results  Component Value Date   NA 139 09/14/2021   K 4.7 09/14/2021   CO2 22 09/14/2021   GLUCOSE 71 09/14/2021   BUN 12 09/14/2021  CREATININE 0.78 09/14/2021   BILITOT 0.5 09/14/2021   ALKPHOS 35 (L) 09/14/2021   AST 15 09/14/2021   ALT 11 09/14/2021   PROT 6.9 09/14/2021   ALBUMIN 4.3 09/14/2021   CALCIUM 9.2 09/14/2021   EGFR 105 09/14/2021   Lab Results  Component Value Date   CHOL 167 09/14/2021   Lab Results  Component Value Date   HDL 56 09/14/2021   Lab Results  Component Value Date   LDLCALC 95 09/14/2021   Lab Results  Component Value Date   TRIG 88 09/14/2021   Lab Results  Component Value Date   CHOLHDL 3.0 09/14/2021      Assessment & Plan:  1. Routine general medical examination at a health care facility Annual wellness visit today. Reviewed labs with patient which were all essentially normal.   2. Dizziness Resolved.    Problem List Items Addressed This Visit       Other   Dizziness   Other Visit Diagnoses     Routine general medical examination at a health care facility    -  Primary       Follow-up: Return in about 1 year (around 10/13/2022) for health maintenance exam, FBW a week prior to visit with free t4 - need to get records from [place].    Ronnell Freshwater, NP

## 2021-11-21 ENCOUNTER — Encounter: Payer: Self-pay | Admitting: Nurse Practitioner

## 2021-11-21 ENCOUNTER — Ambulatory Visit: Payer: 59 | Admitting: Nurse Practitioner

## 2021-11-21 ENCOUNTER — Other Ambulatory Visit: Payer: Self-pay

## 2021-11-21 VITALS — BP 114/72 | HR 91 | Temp 98.0°F | Ht 66.0 in | Wt 180.5 lb

## 2021-11-21 DIAGNOSIS — J040 Acute laryngitis: Secondary | ICD-10-CM

## 2021-11-21 DIAGNOSIS — R0981 Nasal congestion: Secondary | ICD-10-CM | POA: Diagnosis not present

## 2021-11-21 LAB — POCT RAPID STREP A (OFFICE): Rapid Strep A Screen: NEGATIVE

## 2021-11-21 LAB — POCT INFLUENZA A/B
Influenza A, POC: NEGATIVE
Influenza B, POC: NEGATIVE

## 2021-11-21 MED ORDER — FLUTICASONE PROPIONATE 50 MCG/ACT NA SUSP: 2.0000 | Freq: Every day | NASAL | 6 refills | Status: AC

## 2021-11-21 NOTE — Progress Notes (Signed)
wEstablished patient visit   Patient: Vanessa Allison   DOB: Oct 20, 1990   30 y.o. Female  MRN: [id number] Visit Date: 11/21/2021  Chief Complaint  Patient presents with   Nasal Congestion   Subjective    HPI  The patient states that on Thursday, she woke up with no voice. Had not other symptoms. By Saturday, she developed a congested sounding cough, coughing up yellowish phlegm. Today, she woke with with pain under her ears and in neck area. She denies fever, chills, and no body aches. She has been fatigued. Denies sore throat. She denies nausea or vomiting. She does not know of sick contacts. She has taken two home tests for COVIID 19 and both were negative.   Medications: Outpatient Medications Prior to Visit  Medication Sig   norethindrone (MICRONOR) 0.35 MG tablet    norethindrone-ethinyl estradiol (LOESTRIN) 1-20 MG-MCG tablet 1 tablet   [DISCONTINUED] norethindrone (CAMILA) 0.35 MG tablet 1 tablet   No facility-administered medications prior to visit.    Review of Systems  Constitutional:  Positive for fatigue. Negative for activity change, appetite change, chills and fever.  HENT:  Positive for congestion, rhinorrhea and voice change. Negative for sinus pressure, sinus pain, sneezing and sore throat.   Eyes: Negative.   Respiratory:  Positive for cough. Negative for chest tightness, shortness of breath and wheezing.   Cardiovascular:  Negative for chest pain and palpitations.  Gastrointestinal:  Negative for abdominal pain, constipation, diarrhea, nausea and vomiting.  Endocrine: Negative for cold intolerance, heat intolerance, polydipsia and polyuria.  Genitourinary:  Negative for dyspareunia, dysuria, flank pain, frequency and urgency.  Musculoskeletal:  Negative for arthralgias, back pain and myalgias.  Skin:  Negative for rash.  Allergic/Immunologic: Positive for environmental allergies.  Neurological:  Negative for dizziness, weakness and headaches.  Hematological:   Positive for adenopathy.  Psychiatric/Behavioral:  The patient is not nervous/anxious.     Objective     Today's Vitals   11/21/21 1054  BP: 114/72  Pulse: 91  Temp: 98 F (36.7 C)  SpO2: 98%  Weight: 180 lb 8 oz (81.9 kg)  Height: 5\' 6"  (1.676 m)   Body mass index is 29.13 kg/m.   Physical Exam Vitals and nursing note reviewed.  Constitutional:      Appearance: Normal appearance. She is well-developed.  HENT:     Head: Normocephalic and atraumatic.     Right Ear: Hearing, tympanic membrane, ear canal and external ear normal.     Left Ear: Hearing, tympanic membrane, ear canal and external ear normal.     Nose: Congestion and rhinorrhea present.     Mouth/Throat:     Pharynx: Posterior oropharyngeal erythema present.     Tonsils: 1+ on the right. 1+ on the left.  Eyes:     Pupils: Pupils are equal, round, and reactive to light.  Cardiovascular:     Rate and Rhythm: Normal rate and regular rhythm.     Pulses: Normal pulses.     Heart sounds: Normal heart sounds.  Pulmonary:     Effort: Pulmonary effort is normal.     Breath sounds: Normal breath sounds.  Abdominal:     Palpations: Abdomen is soft.  Musculoskeletal:        General: Normal range of motion.     Cervical back: Normal range of motion and neck supple.  Lymphadenopathy:     Cervical: Cervical adenopathy present.  Skin:    General: Skin is warm and dry.     Capillary  Refill: Capillary refill takes less than 2 seconds.  Neurological:     General: No focal deficit present.     Mental Status: She is alert and oriented to person, place, and time.  Psychiatric:        Mood and Affect: Mood normal.        Behavior: Behavior normal.        Thought Content: Thought content normal.        Judgment: Judgment normal.     Results for orders placed or performed in visit on 11/21/21  POCT rapid strep A  Result Value Ref Range   Rapid Strep A Screen Negative Negative  POCT Influenza A/B  Result Value Ref  Range   Influenza A, POC Negative Negative   Influenza B, POC Negative Negative    Assessment & Plan     1. Nasal congestion Flu and strep screens both negative today. Add flonase nasal spray. Use one to 2 sprays in both nostrils every day. Rest and increase fluids. Continue using OTC medication to control symptoms.   - POCT rapid strep A - POCT Influenza A/B - fluticasone (FLONASE) 50 MCG/ACT nasal spray; Place 2 sprays into both nostrils daily.  Dispense: 16 g; Refill: 6  2. Laryngitis Gargle with warm salt water as needed to reduce any sore throat and to help with vocal cord swelling.    Return for prn worsening or persistent symptoms.        Carlean Jews, NP  [facility] at [facility] [phone] (phone) [phone] (fax)  [facility]

## 2022-07-26 ENCOUNTER — Other Ambulatory Visit: Payer: Self-pay | Admitting: Family Medicine

## 2022-07-26 DIAGNOSIS — R102 Pelvic and perineal pain: Secondary | ICD-10-CM

## 2022-08-03 ENCOUNTER — Other Ambulatory Visit: Payer: Self-pay | Admitting: Family Medicine

## 2022-08-03 ENCOUNTER — Other Ambulatory Visit: Payer: 59

## 2022-08-03 DIAGNOSIS — R102 Pelvic and perineal pain: Secondary | ICD-10-CM

## 2022-08-16 ENCOUNTER — Ambulatory Visit (LOCAL_COMMUNITY_HEALTH_CENTER): Payer: Self-pay

## 2022-08-16 DIAGNOSIS — Z23 Encounter for immunization: Secondary | ICD-10-CM

## 2022-08-16 DIAGNOSIS — Z719 Counseling, unspecified: Secondary | ICD-10-CM

## 2022-08-16 NOTE — Progress Notes (Signed)
  Are you feeling sick today? No   Have you ever received a dose of COVID-19 Vaccine? (Pfizer, Janssen, Moderna, Novavax, Other) Yes  If yes, which vaccine and how many doses?   Pfizer, 5   Did you bring the vaccination record card or other documentation?  Yes   Do you have a health condition or are undergoing treatment that makes you moderately or severely immunocompromised? This would include, but not be limited to: cancer, HIV, organ transplant, immunosuppressive therapy/high-dose corticosteroids, or moderate/severe primary immunodeficiency.  No  Have you received COVID-19 vaccine before or during hematopoietic cell transplant (HCT) or CAR-T-cell therapies? No  Have you ever had an allergic reaction to: (This would include a severe allergic reaction or a reaction that caused hives, swelling, or respiratory distress, including wheezing.) A component of a COVID-19 vaccine or a previous dose of COVID-19 vaccine? No   Have you ever had an allergic reaction to another vaccine (other thanCOVID-19 vaccine) or an injectable medication? (This would include a severe allergic reaction or a reaction that caused hives, swelling, or respiratory distress, including wheezing.)   No    Do you have a history of any of the following:  Myocarditis or Pericarditis No  Dermal fillers:  No  Multisystem Inflammatory Syndrome (MIS-C or MIS-A)? No  COVID-19 disease within the past 3 months? No  Vaccinated with monkeypox vaccine in the last 4 weeks? No  Eligible, administered Pfizer Comirnaty 12y+2023-24, monitored, tolerated well. M.Willmer Fellers, LPN.    

## 2022-08-19 ENCOUNTER — Ambulatory Visit
Admission: RE | Admit: 2022-08-19 | Discharge: 2022-08-19 | Disposition: A | Discharge status: Home / Self Care | Payer: No Typology Code available for payment source | Source: Ambulatory Visit | Attending: Family Medicine | Admitting: Family Medicine

## 2022-08-19 ENCOUNTER — Other Ambulatory Visit: Payer: Self-pay

## 2022-08-19 DIAGNOSIS — R102 Pelvic and perineal pain: Secondary | ICD-10-CM

## 2022-10-09 ENCOUNTER — Other Ambulatory Visit: Payer: 59

## 2022-10-16 ENCOUNTER — Encounter: Payer: 59 | Admitting: Nurse Practitioner

## 2024-07-01 ENCOUNTER — Other Ambulatory Visit (HOSPITAL_COMMUNITY)
Admission: RE | Admit: 2024-07-01 | Discharge: 2024-07-01 | Disposition: A | Discharge status: Home / Self Care | Payer: Self-pay | Source: Ambulatory Visit | Attending: Obstetrics and Gynecology | Admitting: Obstetrics and Gynecology

## 2024-07-01 ENCOUNTER — Other Ambulatory Visit: Payer: Self-pay | Admitting: Obstetrics and Gynecology

## 2024-07-01 DIAGNOSIS — Z01419 Encounter for gynecological examination (general) (routine) without abnormal findings: Secondary | ICD-10-CM | POA: Insufficient documentation

## 2024-07-04 LAB — CYTOLOGY - PAP
Comment: NEGATIVE
Diagnosis: NEGATIVE
High risk HPV: NEGATIVE
